# Patient Record
Sex: Female | Born: 1955 | Race: Black or African American | Hispanic: No | Marital: Single | Smoking: Never smoker
Health system: Southern US, Community
[De-identification: ages and names within clinical notes are randomized; demographics above are authoritative.]

## PROBLEM LIST (undated history)

## (undated) DIAGNOSIS — Z9884 Bariatric surgery status: Secondary | ICD-10-CM

## (undated) DIAGNOSIS — R7303 Prediabetes: Secondary | ICD-10-CM

## (undated) DIAGNOSIS — R519 Headache, unspecified: Secondary | ICD-10-CM

## (undated) DIAGNOSIS — E785 Hyperlipidemia, unspecified: Secondary | ICD-10-CM

## (undated) DIAGNOSIS — I2510 Atherosclerotic heart disease of native coronary artery without angina pectoris: Secondary | ICD-10-CM

## (undated) DIAGNOSIS — M150 Primary generalized (osteo)arthritis: Secondary | ICD-10-CM

## (undated) DIAGNOSIS — F329 Major depressive disorder, single episode, unspecified: Secondary | ICD-10-CM

## (undated) DIAGNOSIS — M1710 Unilateral primary osteoarthritis, unspecified knee: Secondary | ICD-10-CM

## (undated) DIAGNOSIS — R51 Headache: Secondary | ICD-10-CM

## (undated) DIAGNOSIS — F32A Depression, unspecified: Secondary | ICD-10-CM

## (undated) DIAGNOSIS — M179 Osteoarthritis of knee, unspecified: Secondary | ICD-10-CM

## (undated) DIAGNOSIS — I10 Essential (primary) hypertension: Secondary | ICD-10-CM

## (undated) HISTORY — PX: JOINT REPLACEMENT: SHX530

## (undated) HISTORY — DX: Essential (primary) hypertension: I10

## (undated) HISTORY — PX: CARDIAC CATHETERIZATION: SHX172

## (undated) HISTORY — DX: Hyperlipidemia, unspecified: E78.5

## (undated) HISTORY — PX: LAPAROSCOPIC GASTRIC SLEEVE RESECTION: SHX5895

---
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
## 1986-04-22 HISTORY — PX: VAGINAL HYSTERECTOMY: SUR661

## 1997-11-21 ENCOUNTER — Encounter: Admission: RE | Admit: 1997-11-21 | Discharge: 1997-11-21 | Payer: Self-pay | Admitting: Internal Medicine

## 1998-05-19 ENCOUNTER — Encounter: Admission: RE | Admit: 1998-05-19 | Discharge: 1998-05-19 | Payer: Self-pay | Admitting: Internal Medicine

## 1998-06-05 ENCOUNTER — Encounter: Payer: Self-pay | Admitting: Emergency Medicine

## 1998-06-05 ENCOUNTER — Emergency Department (HOSPITAL_COMMUNITY): Admission: EM | Admit: 1998-06-05 | Discharge: 1998-06-05 | Payer: Self-pay | Admitting: Emergency Medicine

## 1998-11-20 ENCOUNTER — Emergency Department (HOSPITAL_COMMUNITY): Admission: EM | Admit: 1998-11-20 | Discharge: 1998-11-20 | Payer: Self-pay | Admitting: Emergency Medicine

## 1999-01-09 ENCOUNTER — Encounter: Admission: RE | Admit: 1999-01-09 | Discharge: 1999-01-09 | Payer: Self-pay | Admitting: Internal Medicine

## 1999-01-26 ENCOUNTER — Encounter: Admission: RE | Admit: 1999-01-26 | Discharge: 1999-04-26 | Payer: Self-pay | Admitting: *Deleted

## 1999-01-26 ENCOUNTER — Encounter: Admission: RE | Admit: 1999-01-26 | Discharge: 1999-01-26 | Payer: Self-pay | Admitting: Internal Medicine

## 1999-02-16 ENCOUNTER — Ambulatory Visit (HOSPITAL_BASED_OUTPATIENT_CLINIC_OR_DEPARTMENT_OTHER): Admission: RE | Admit: 1999-02-16 | Discharge: 1999-02-16 | Payer: Self-pay | Admitting: Orthopedic Surgery

## 1999-03-09 ENCOUNTER — Encounter: Admission: RE | Admit: 1999-03-09 | Discharge: 1999-03-09 | Payer: Self-pay | Admitting: Obstetrics & Gynecology

## 1999-06-18 ENCOUNTER — Emergency Department (HOSPITAL_COMMUNITY): Admission: EM | Admit: 1999-06-18 | Discharge: 1999-06-18 | Payer: Self-pay | Admitting: Emergency Medicine

## 1999-06-27 ENCOUNTER — Emergency Department (HOSPITAL_COMMUNITY): Admission: EM | Admit: 1999-06-27 | Discharge: 1999-06-27 | Payer: Self-pay | Admitting: Emergency Medicine

## 1999-09-27 ENCOUNTER — Encounter: Admission: RE | Admit: 1999-09-27 | Discharge: 1999-09-27 | Payer: Self-pay | Admitting: Internal Medicine

## 2000-01-13 ENCOUNTER — Emergency Department (HOSPITAL_COMMUNITY): Admission: EM | Admit: 2000-01-13 | Discharge: 2000-01-13 | Payer: Self-pay | Admitting: Emergency Medicine

## 2000-04-21 ENCOUNTER — Emergency Department (HOSPITAL_COMMUNITY): Admission: EM | Admit: 2000-04-21 | Discharge: 2000-04-21 | Payer: Self-pay | Admitting: Emergency Medicine

## 2000-07-11 ENCOUNTER — Emergency Department (HOSPITAL_COMMUNITY): Admission: EM | Admit: 2000-07-11 | Discharge: 2000-07-11 | Payer: Self-pay | Admitting: Emergency Medicine

## 2000-10-28 ENCOUNTER — Other Ambulatory Visit: Admission: RE | Admit: 2000-10-28 | Discharge: 2000-10-28 | Payer: Self-pay | Admitting: Obstetrics and Gynecology

## 2001-05-13 ENCOUNTER — Emergency Department (HOSPITAL_COMMUNITY): Admission: EM | Admit: 2001-05-13 | Discharge: 2001-05-13 | Payer: Self-pay | Admitting: Emergency Medicine

## 2002-04-22 ENCOUNTER — Emergency Department (HOSPITAL_COMMUNITY): Admission: EM | Admit: 2002-04-22 | Discharge: 2002-04-22 | Payer: Self-pay

## 2002-05-01 ENCOUNTER — Emergency Department (HOSPITAL_COMMUNITY): Admission: EM | Admit: 2002-05-01 | Discharge: 2002-05-01 | Payer: Self-pay

## 2002-11-23 ENCOUNTER — Encounter: Payer: Self-pay | Admitting: Internal Medicine

## 2002-11-23 ENCOUNTER — Encounter: Admission: RE | Admit: 2002-11-23 | Discharge: 2002-11-23 | Payer: Self-pay | Admitting: Internal Medicine

## 2004-01-15 ENCOUNTER — Ambulatory Visit (HOSPITAL_BASED_OUTPATIENT_CLINIC_OR_DEPARTMENT_OTHER): Admission: RE | Admit: 2004-01-15 | Discharge: 2004-01-15 | Payer: Self-pay | Admitting: Internal Medicine

## 2004-05-10 ENCOUNTER — Encounter: Admission: RE | Admit: 2004-05-10 | Discharge: 2004-05-10 | Payer: Self-pay | Admitting: Internal Medicine

## 2005-05-15 ENCOUNTER — Encounter: Admission: RE | Admit: 2005-05-15 | Discharge: 2005-05-15 | Payer: Self-pay | Admitting: Internal Medicine

## 2005-07-04 ENCOUNTER — Emergency Department (HOSPITAL_COMMUNITY): Admission: EM | Admit: 2005-07-04 | Discharge: 2005-07-04 | Payer: Self-pay | Admitting: Emergency Medicine

## 2006-05-28 ENCOUNTER — Emergency Department (HOSPITAL_COMMUNITY): Admission: EM | Admit: 2006-05-28 | Discharge: 2006-05-28 | Payer: Self-pay | Admitting: Emergency Medicine

## 2007-06-27 ENCOUNTER — Emergency Department (HOSPITAL_COMMUNITY): Admission: EM | Admit: 2007-06-27 | Discharge: 2007-06-27 | Payer: Self-pay | Admitting: Emergency Medicine

## 2009-09-22 ENCOUNTER — Emergency Department (HOSPITAL_COMMUNITY): Admission: EM | Admit: 2009-09-22 | Discharge: 2009-09-22 | Payer: Self-pay | Admitting: Emergency Medicine

## 2010-05-13 ENCOUNTER — Encounter: Payer: Self-pay | Admitting: Internal Medicine

## 2010-09-07 NOTE — Procedures (Signed)
NAME:  Kathleen Obrien, Kathleen Obrien               ACCOUNT NO.:  [id number]   MEDICAL RECORD NO.:  [id number]          PATIENT TYPE:  OUT   LOCATION:  SLEEP CENTER                 FACILITY:  [facility]   PHYSICIAN:  Clinton D. Maple Hudson, M.D. DATE OF BIRTH:  1955/08/06   DATE OF STUDY:  01/15/2004  DATE OF DISCHARGE:  01/15/2004                              NOCTURNAL POLYSOMNOGRAM   REFERRING PHYSICIAN:  Dr. Fleet Contras   INDICATION FOR STUDY:  Insomnia with obstructive sleep apnea.  Epworth  sleepiness score 5/24.  BMI 57.  Weight 402 pounds.   MEDICATIONS:  1.  HCTZ.  2.  Percocet.  3.  Celebrex.   SLEEP ARCHITECTURE:  Short total sleep time 261 minutes with sleep  efficiency 58%.  Stage I was 5%, stage II 77%, stages III and IV were  absent.  REM was 18% of total sleep time.  Latency to sleep onset 100  minutes.  Latency to REM 212 minutes.  Sustained sleep was not achieved  until 12:30 a.m. and there was a prolonged interval awake between 2 a.m. and  3:30 a.m.   RESPIRATORY DATA:  RDI 20/hour indicating moderate obstructive sleep  apnea/hypopnea syndrome.  This reflected 78 hypopneas and 10 obstructive  apneas.  Events were not positional.  REM RDI was 70/hour.  The technician  was unable to utilize split study protocol for CPAP titration because  sustained events did not develop until too late to rule out time for  adequate titration.   OXYGEN DATA:  Mild to moderate snoring with intervals of severe oxygen  desaturation to a nadir of 65% associated with respiratory events.  Otherwise, mean oxygen saturation was 92-94%, somewhat lower during REM.   CARDIAC DATA:  Normal sinus rhythm.   MOVEMENT/PARASOMNIA:  Occasional leg jerk with insignificant effect on  sleep.   IMPRESSION/RECOMMENDATION:  Moderate obstructive sleep apnea/hypopnea  syndrome, RDI 20/hour with oxygen desaturation to 65%.  Consider return for  CPAP titration or evaluate for alternative therapies as appropriate.    CDY/MEDQ  D:  01/22/2004 12:18:31  T:  01/23/2004 08:01:04  Job:  [id number]

## 2011-01-14 LAB — BASIC METABOLIC PANEL
BUN: 9
CO2: 29
Calcium: 9.1
Creatinine, Ser: 0.76

## 2011-01-14 LAB — URINALYSIS, ROUTINE W REFLEX MICROSCOPIC
Hgb urine dipstick: NEGATIVE
Ketones, ur: NEGATIVE
Nitrite: NEGATIVE

## 2012-04-28 ENCOUNTER — Other Ambulatory Visit: Payer: Self-pay | Admitting: Internal Medicine

## 2012-04-28 DIAGNOSIS — Z1231 Encounter for screening mammogram for malignant neoplasm of breast: Secondary | ICD-10-CM

## 2012-05-22 ENCOUNTER — Ambulatory Visit: Payer: Self-pay

## 2012-05-29 ENCOUNTER — Ambulatory Visit
Admission: RE | Admit: 2012-05-29 | Discharge: 2012-05-29 | Disposition: A | Discharge status: Home / Self Care | Payer: 59 | Source: Ambulatory Visit | Attending: Internal Medicine | Admitting: Internal Medicine

## 2012-05-29 DIAGNOSIS — Z1231 Encounter for screening mammogram for malignant neoplasm of breast: Secondary | ICD-10-CM

## 2013-07-01 ENCOUNTER — Encounter (INDEPENDENT_AMBULATORY_CARE_PROVIDER_SITE_OTHER): Payer: Self-pay | Admitting: General Surgery

## 2013-07-01 ENCOUNTER — Other Ambulatory Visit (INDEPENDENT_AMBULATORY_CARE_PROVIDER_SITE_OTHER): Payer: Self-pay | Admitting: General Surgery

## 2013-07-01 ENCOUNTER — Encounter (INDEPENDENT_AMBULATORY_CARE_PROVIDER_SITE_OTHER): Payer: Self-pay

## 2013-07-01 ENCOUNTER — Ambulatory Visit (INDEPENDENT_AMBULATORY_CARE_PROVIDER_SITE_OTHER): Payer: 59 | Admitting: General Surgery

## 2013-07-01 VITALS — BP 130/86 | HR 72 | Temp 98.6°F | Resp 18 | Ht 68.0 in | Wt 357.0 lb

## 2013-07-01 DIAGNOSIS — Z6841 Body Mass Index (BMI) 40.0 and over, adult: Secondary | ICD-10-CM

## 2013-07-01 NOTE — Progress Notes (Addendum)
Patient ID: Kathleen Obrien, female   DOB: [birth year], 58 y.o.   MRN: [id number]  Chief Complaint  Patient presents with  . Bariatric Pre-op    HPI Kathleen Obrien is a 58 y.o. female.   HPI 58 yo morbidly obese AAF referred by Dr Cyndy Freeze For evaluation for weight loss surgery. The patient is primarily interested in a Roux-en-Y gastric bypass. After attending 3 seminars she believes this will be the most successful procedure for her. The idea of a laparoscopic adjustable gastric band does not appeal to her.   She has struggled with her weight for many years. She states that she has lost and gained weight numerous times. Despite numerous attempts were sustained weight loss she has been unsuccessful. She has tried Weight Watchers, formula 3, Sensa , and physician directed weight loss-all without any long-term success. She did Weight Watchers twice. The first attempt she lost 80 pounds but gained it all back. She makes furniture. She states that she is tired of not being able to get around due to her severe bilateral knee pain. She also states that she also wants to feel like a person again and be more independent  Past Medical History  Diagnosis Date  . Hyperlipidemia   . Hypertension   . Diabetes mellitus without complication     Past Surgical History  Procedure Laterality Date  . Cesarean section    . Abdominal hysterectomy      History reviewed. No pertinent family history.  Social History History  Substance Use Topics  . Smoking status: Never Smoker   . Smokeless tobacco: Not on file  . Alcohol Use: No    No Known Allergies  Current Outpatient Prescriptions  Medication Sig Dispense Refill  . aMILoride (MIDAMOR) 5 MG tablet       . atenolol (TENORMIN) 50 MG tablet       . atorvastatin (LIPITOR) 10 MG tablet       . Diclofenac Sodium 1.5 % SOLN       . losartan (COZAAR) 100 MG tablet       . meloxicam (MOBIC) 15 MG tablet       . metFORMIN (GLUCOPHAGE-XR) 500 MG  24 hr tablet       . NITROSTAT 0.4 MG SL tablet       . traMADol (ULTRAM) 50 MG tablet        No current facility-administered medications for this visit.    Review of Systems Review of Systems  Constitutional: Negative for fever, activity change, appetite change and unexpected weight change.  HENT: Negative for nosebleeds and trouble swallowing.   Eyes: Negative for photophobia and visual disturbance.  Respiratory: Negative for chest tightness and shortness of breath.   Cardiovascular: Negative for chest pain and leg swelling.       Denies CP, SOB, orthopnea, PND, positive DOE; no jaw/neck/arm pain  Gastrointestinal: Negative for nausea, vomiting, abdominal pain, diarrhea and constipation.       Denies reflux  Genitourinary: Negative for dysuria and difficulty urinating.       Last mammogram last year - normal. S/p abd hysterectomy. G1P1  Musculoskeletal: Negative for arthralgias.       Has b/l knee pain - knees have DJD. Has seen orthopedic surgeon and needs b/l knee replacements but was told she needed to lose 75 pounds.   Skin: Negative for pallor and rash.  Neurological: Negative for dizziness, seizures, facial asymmetry and numbness.       Denies TIA  and amaurosis fugax   Hematological: Negative for adenopathy. Does not bruise/bleed easily.  Psychiatric/Behavioral: Negative for behavioral problems and agitation.    Blood pressure 130/86, pulse 72, temperature 98.6 F (37 C), temperature source Temporal, resp. rate 18, height 5\' 8"  (1.727 m), weight 357 lb (161.934 kg).  Physical Exam Physical Exam  Vitals reviewed. Constitutional: She is oriented to person, place, and time. She appears well-developed and well-nourished. No distress.  Morbidly obesity, evenly distributed  HENT:  Head: Normocephalic and atraumatic.  Right Ear: External ear normal.  Left Ear: External ear normal.  Eyes: Conjunctivae are normal. No scleral icterus.  Neck: Normal range of motion. Neck  supple. No tracheal deviation present. No thyromegaly present.  Cardiovascular: Normal rate and normal heart sounds.   Pulmonary/Chest: Effort normal and breath sounds normal. No stridor. No respiratory distress. She has no wheezes.  Abdominal: Soft. She exhibits no distension. There is no tenderness. There is no rebound and no guarding.    Musculoskeletal: She exhibits no edema (no significant edema) and no tenderness.       Right knee: She exhibits decreased range of motion.       Left knee: She exhibits decreased range of motion.  Uses a walker; some bone on bone mvmt in b/l knees  Lymphadenopathy:    She has no cervical adenopathy.  Neurological: She is alert and oriented to person, place, and time. She exhibits normal muscle tone.  Skin: Skin is warm and dry. No rash noted. She is not diaphoretic. No erythema.  Psychiatric: She has a normal mood and affect. Her behavior is normal. Judgment and thought content normal.    Data Reviewed epworth sleepiness scale score 2 Old sleep study from 2005 - moderate OSA  Date Requested Recent colonscopy and path report from Dr Hale Droneamsay - [facility]  Assessment    Morbid obesity BMI 54.3 Diabetes Mellitus type 2 Degenerative Joint Disease - b/l knees HTN HPL     Plan    The patient meets weight loss surgery criteria. I think the patient would be an acceptable candidate for Laparoscopic Roux-en-Y Gastric bypass.   We discussed laparoscopic Roux-en-Y gastric bypass. We discussed the preoperative, operative and postoperative process. Using diagrams, I explained the surgery in detail including the performance of an EGD near the end of the surgery and an Upper GI swallow study on POD 1. We discussed the typical hospital course including a 2-3 day stay baring any complications.   The patient was given educational material. I quoted the patient that they can expect to lose 50-70% of  their excess weight with the gastric bypass. We did discuss the possibility of weight regain several years after the procedure.  We discussed the risk and benefits of surgery including but not limited to anesthesia risk, bleeding, infection, anastomotic edema requiring a few additional days in the hospital, postop nausea, possible conversion to open procedure, blood clot formation, anastomotic leak, anastomotic stricture, ulcer formation, death, respiratory complications, intestinal blockage, internal hernia, gallstone formation, vitamin and nutritional deficiencies, hair loss, weight regain injury to surrounding structures, failure to lose weight and mood changes.  We discussed that before and after surgery that there would be an alteration in their diet. I explained that we have put them on a diet 2 weeks before surgery. I also explained that they would be on a liquid diet for 2 weeks after surgery. We discussed that they would have to avoid certain  foods such as sugar after surgery. We discussed the importance of physical activity as well as compliance with our dietary and supplement recommendations and routine follow-up.  I explained to the patient that we will start our evaluation process which includes labs, Upper GI to evaluate stomach and swallowing anatomy, nutritionist consultation, psychiatrist consultation, EKG, CXR, abdominal ultrasound.  She was also given access to watch EMMI video on gastric bypass  I will review her outside cardiology records and then determine whether or not she will need cardiac consultation.   She was given information about our pathway to surgery  Mary Sella. Andrey Campanile, MD, FACS General, Bariatric, & Minimally Invasive Surgery [facility], [place]  Addendum 10/14/2013: Received records from [facility] dated 01/17/2011-29 year old obese female referred by Drs Konrad Felix & Caffie Damme for an abnormal stress test. She had been having atypical chest  pain. She underwent nuclear stress test found to have ischemia at the inferior septal, inferior and apical inferior wall. LV ejection fraction 54%. By Dr Caryl Ada.   Underwent cardiac catheterization on 03/01/2011 by Dr. Holley Raring -  Conclusions: #1 difficult to feel coronaries with contrast to large size vessels, also difficult to visualize due to patient's body habitus specifically in caudal views #2 mild coronary artery disease noted. LV function is normal. Ejection fraction 65% #3 medical treatment recommended  Since she had a clean cardiac catheterization and she has no significant symptoms currently I believe we can forgo formal cardiac consultation. However if her EKG is abnormal that we'll certainly change things.  Mary Sella. Andrey Campanile, MD, FACS General, Bariatric, & Minimally Invasive Surgery [facility], [place]           [facility] M 07/01/2013, 11:41 AM

## 2013-07-01 NOTE — Patient Instructions (Signed)
Congratulations on starting your journey to a healthier life! Over the next few weeks you will be undergoing tests (x-rays and labs) and seeing specialists to help evaluate you for weight loss surgery.  These tests and consultations with a psychologist and nutritionist are needed to prepare you for the lifestyle changes that lie ahead and are often required by insurance companies to approve you for surgery.   Pathway to Surgery:  Over the next few weeks -->Lab work -->Radiology tests   - Chest x-ray - make sure your lungs are normal before surgery  - Upper GI - you drink barium and pictures are taken as it travels down your  esophagus and into your stomach - looks for reflux and a hiatal hernia which may  need to repaired at the same time as your weight loss surgery  - Abdominal Ultrasound - looks at your gallbladder and liver  - Mammogram - up to date mammogram if you are a female -->EKG  -->Sleep study - if you are felt to be at high risk for obstructive sleep apnea -->H. Pylori breath test ([employer]) - you surgeon may order this test to see if you have  a bacteria (H pylori) in your stomach which makes you at higher risk to develop a  ulcer or inflammation of your stomach -->Nutrition consultation -->Psychologist consultation -->Other specialist consults - your surgeon may determine that you need to see a  specialist like a cardiologist or pulmnologist depending on your health history -->Watch EMMI video about your planned weight loss surgery  Two weeks prior to surgery  Go on the extremely low carb liquid diet - this will decrease the size of your liver  which will make surgery safer  Attend preoperative appointment with your surgeon  Attend preoperative surgery class  One week prior to surgery  No aspirin products.  Tylenol is acceptable   24 hours prior to surgery  No alcoholic beverages  Report fever greater than 100.5 or excessive nasal drainage suggesting infection  Continue  bariatric preop diet  Perform bowel prep if ordered  Do not eat or drink anything after midnight the night before surgery  Do not take any medications except those instructed by the anesthesiologist  Morning of surgery  Please arrive at the hospital at least 2 hours before your scheduled surgery time.  No makeup, fingernail polish or jewelry  Bring insurance cards with you  Bring your CPAP mask if you use this  

## 2013-07-02 ENCOUNTER — Other Ambulatory Visit (INDEPENDENT_AMBULATORY_CARE_PROVIDER_SITE_OTHER): Payer: Self-pay | Admitting: General Surgery

## 2013-07-02 DIAGNOSIS — Z1231 Encounter for screening mammogram for malignant neoplasm of breast: Secondary | ICD-10-CM

## 2013-07-09 ENCOUNTER — Other Ambulatory Visit: Payer: Self-pay

## 2013-07-09 ENCOUNTER — Ambulatory Visit (HOSPITAL_COMMUNITY)
Admission: RE | Admit: 2013-07-09 | Discharge: 2013-07-09 | Disposition: A | Discharge status: Home / Self Care | Payer: 59 | Source: Ambulatory Visit | Attending: General Surgery | Admitting: General Surgery

## 2013-07-09 ENCOUNTER — Ambulatory Visit (HOSPITAL_COMMUNITY): Payer: 59

## 2013-07-09 DIAGNOSIS — I10 Essential (primary) hypertension: Secondary | ICD-10-CM | POA: Insufficient documentation

## 2013-07-09 DIAGNOSIS — M1710 Unilateral primary osteoarthritis, unspecified knee: Secondary | ICD-10-CM | POA: Insufficient documentation

## 2013-07-09 DIAGNOSIS — Z6841 Body Mass Index (BMI) 40.0 and over, adult: Secondary | ICD-10-CM | POA: Insufficient documentation

## 2013-07-09 DIAGNOSIS — E785 Hyperlipidemia, unspecified: Secondary | ICD-10-CM | POA: Insufficient documentation

## 2013-07-09 DIAGNOSIS — Z1231 Encounter for screening mammogram for malignant neoplasm of breast: Secondary | ICD-10-CM

## 2013-07-09 DIAGNOSIS — I517 Cardiomegaly: Secondary | ICD-10-CM | POA: Insufficient documentation

## 2013-07-09 DIAGNOSIS — I498 Other specified cardiac arrhythmias: Secondary | ICD-10-CM | POA: Insufficient documentation

## 2013-07-09 DIAGNOSIS — E119 Type 2 diabetes mellitus without complications: Secondary | ICD-10-CM | POA: Insufficient documentation

## 2013-07-30 ENCOUNTER — Ambulatory Visit: Payer: 59

## 2013-07-30 ENCOUNTER — Other Ambulatory Visit: Payer: Self-pay

## 2013-07-30 ENCOUNTER — Ambulatory Visit
Admission: RE | Admit: 2013-07-30 | Discharge: 2013-07-30 | Disposition: A | Discharge status: Home / Self Care | Payer: 59 | Source: Ambulatory Visit

## 2013-07-30 DIAGNOSIS — Z1231 Encounter for screening mammogram for malignant neoplasm of breast: Secondary | ICD-10-CM

## 2013-08-02 ENCOUNTER — Encounter: Payer: Self-pay | Admitting: Dietician

## 2013-08-02 ENCOUNTER — Encounter: Payer: 59 | Attending: General Surgery | Admitting: Dietician

## 2013-08-02 VITALS — Ht 68.0 in | Wt 362.7 lb

## 2013-08-02 DIAGNOSIS — Z01818 Encounter for other preprocedural examination: Secondary | ICD-10-CM | POA: Insufficient documentation

## 2013-08-02 DIAGNOSIS — Z6841 Body Mass Index (BMI) 40.0 and over, adult: Secondary | ICD-10-CM | POA: Insufficient documentation

## 2013-08-02 DIAGNOSIS — Z713 Dietary counseling and surveillance: Secondary | ICD-10-CM | POA: Insufficient documentation

## 2013-08-02 NOTE — Patient Instructions (Signed)
[facility] scheduled to enroll in Pre-Op Class.  Work on following Pre-Op Goals and look for protein shakes to try. Contact insurance company to see if you need to do supervised weight loss.

## 2013-08-02 NOTE — Progress Notes (Signed)
  Pre-Op Assessment Visit:  Pre-Operative RYGB Surgery  Medical Nutrition Therapy:  Appt start time: 1400   End time:  1440.  Patient was seen on 08/02/2013 for Pre-Operative RYGB Nutrition Assessment. Assessment [facility] coordinator on 08/02/2013.   Preferred Learning Style:   No preference indicated   Learning Readiness:   Ready  Handouts given during visit include:  Pre-Op Goals Bariatric Surgery Protein Shakes  Teaching Method Utilized:  Visual Auditory Hands on  Barriers to learning/adherence to lifestyle change: limited mobility  Demonstrated degree of understanding via:  Teach Back   Patient to call the [facility] to enroll in Pre-Op and Post-Op Nutrition Education when surgery date is scheduled.

## 2013-10-01 ENCOUNTER — Encounter (INDEPENDENT_AMBULATORY_CARE_PROVIDER_SITE_OTHER): Payer: Self-pay | Admitting: General Surgery

## 2013-10-15 ENCOUNTER — Other Ambulatory Visit (INDEPENDENT_AMBULATORY_CARE_PROVIDER_SITE_OTHER): Payer: Self-pay | Admitting: General Surgery

## 2014-05-12 ENCOUNTER — Other Ambulatory Visit (INDEPENDENT_AMBULATORY_CARE_PROVIDER_SITE_OTHER): Payer: Self-pay

## 2014-07-11 ENCOUNTER — Other Ambulatory Visit: Payer: Self-pay

## 2014-07-11 DIAGNOSIS — Z1231 Encounter for screening mammogram for malignant neoplasm of breast: Secondary | ICD-10-CM

## 2014-08-09 ENCOUNTER — Ambulatory Visit
Admission: RE | Admit: 2014-08-09 | Discharge: 2014-08-09 | Disposition: A | Discharge status: Home / Self Care | Payer: 59 | Source: Ambulatory Visit

## 2014-08-09 DIAGNOSIS — Z1231 Encounter for screening mammogram for malignant neoplasm of breast: Secondary | ICD-10-CM

## 2015-11-22 ENCOUNTER — Other Ambulatory Visit: Payer: Self-pay | Admitting: Internal Medicine

## 2015-11-22 DIAGNOSIS — Z1231 Encounter for screening mammogram for malignant neoplasm of breast: Secondary | ICD-10-CM

## 2015-11-29 ENCOUNTER — Ambulatory Visit: Payer: 59

## 2015-12-07 ENCOUNTER — Ambulatory Visit: Payer: 59

## 2016-04-22 DIAGNOSIS — Z9884 Bariatric surgery status: Secondary | ICD-10-CM

## 2016-04-22 HISTORY — DX: Bariatric surgery status: Z98.84

## 2017-09-25 ENCOUNTER — Other Ambulatory Visit: Payer: Self-pay | Admitting: Orthopedic Surgery

## 2017-10-01 ENCOUNTER — Encounter (HOSPITAL_COMMUNITY): Payer: Self-pay | Admitting: *Deleted

## 2017-10-17 NOTE — Patient Instructions (Addendum)
Kathleen Obrien  10/17/2017   Your procedure is scheduled on:   10-24-2017  Report to [facility] Main  Entrance  Report to admitting at  7:30 AM    Call this number if you have problems the morning of surgery [phone]   Remember: Do not eat food or drink liquids :After Midnight.     Take these medicines the morning of surgery with A SIP OF WATER:  ATENOLOL, PRILOSEC (OMEPRAZOLE), OXYCODONE  DO NOT TAKE ANY DIABETIC MEDICATIONS DAY OF YOUR SURGERY                               You may not have any metal on your body including hair pins and              piercings  Do not wear jewelry, make-up, lotions, powders or perfumes, deodorant             Do not wear nail polish.  Do not shave  48 hours prior to surgery.    Do not bring valuables to the hospital. [facility] IS NOT             RESPONSIBLE   FOR VALUABLES.  Contacts, dentures or bridgework may not be worn into surgery.  Leave suitcase in the car. After surgery it may be brought to your room.   Special Instructions: COUGH/ DEEP BREATHING AND LEG EXERCISES              Please read over the following fact sheets you were given: _____________________________________________________________________             [facility] - Preparing for Surgery Before surgery, you can play an important role.  Because skin is not sterile, your skin needs to be as free of germs as possible.  You can reduce the number of germs on your skin by washing with CHG (chlorahexidine gluconate) soap before surgery.  CHG is an antiseptic cleaner which kills germs and bonds with the skin to continue killing germs even after washing. Please DO NOT use if you have an allergy to CHG or antibacterial soaps.  If your skin becomes reddened/irritated stop using the CHG and inform your nurse when you arrive at Short Stay. Do not shave (including legs and underarms) for at least 48 hours prior to the first CHG shower.  You may shave your  face/neck. Please follow these instructions carefully:  1.  Shower with CHG Soap the night before surgery and the  morning of Surgery.  2.  If you choose to wash your hair, wash your hair first as usual with your  normal  shampoo.  3.  After you shampoo, rinse your hair and body thoroughly to remove the  shampoo.                                        4.  Use CHG as you would any other liquid soap.  You can apply chg directly  to the skin and wash                       Gently with a scrungie or clean washcloth.  5.  Apply the CHG Soap to your body ONLY FROM THE NECK  DOWN.   Do not use on face/ open                           Wound or open sores. Avoid contact with eyes, ears mouth and genitals (private parts).                       Wash face,  Genitals (private parts) with your normal soap.             6.  Wash thoroughly, paying special attention to the area where your surgery  will be performed.  7.  Thoroughly rinse your body with warm water from the neck down.  8.  DO NOT shower/wash with your normal soap after using and rinsing off  the CHG Soap.             9.  Pat yourself dry with a clean towel.            10.  Wear clean pajamas.            11.  Place clean sheets on your bed the night of your first shower and do not  sleep with pets. Day of Surgery : Do not apply any lotions/deodorants the morning of surgery.  Please wear clean clothes to the hospital/surgery center.  FAILURE TO FOLLOW THESE INSTRUCTIONS MAY RESULT IN THE CANCELLATION OF YOUR SURGERY PATIENT SIGNATURE_________________________________  NURSE SIGNATURE__________________________________  ________________________________________________________________________   Rogelia MireIncentive Spirometer  An incentive spirometer is a tool that can help keep your lungs clear and active. This tool measures how well you are filling your lungs with each breath. Taking long deep breaths may help reverse or decrease the chance of developing  breathing (pulmonary) problems (especially infection) following:  A long period of time when you are unable to move or be active. BEFORE THE PROCEDURE   If the spirometer includes an indicator to show your best effort, your nurse or respiratory therapist will set it to a desired goal.  If possible, sit up straight or lean slightly forward. Try not to slouch.  Hold the incentive spirometer in an upright position. INSTRUCTIONS FOR USE  1. Sit on the edge of your bed if possible, or sit up as far as you can in bed or on a chair. 2. Hold the incentive spirometer in an upright position. 3. Breathe out normally. 4. Place the mouthpiece in your mouth and seal your lips tightly around it. 5. Breathe in slowly and as deeply as possible, raising the piston or the ball toward the top of the column. 6. Hold your breath for 3-5 seconds or for as long as possible. Allow the piston or ball to fall to the bottom of the column. 7. Remove the mouthpiece from your mouth and breathe out normally. 8. Rest for a few seconds and repeat Steps 1 through 7 at least 10 times every 1-2 hours when you are awake. Take your time and take a few normal breaths between deep breaths. 9. The spirometer may include an indicator to show your best effort. Use the indicator as a goal to work toward during each repetition. 10. After each set of 10 deep breaths, practice coughing to be sure your lungs are clear. If you have an incision (the cut made at the time of surgery), support your incision when coughing by placing a pillow or rolled up towels firmly against it. Once you are able to get out of  bed, walk around indoors and cough well. You may stop using the incentive spirometer when instructed by your caregiver.  RISKS AND COMPLICATIONS  Take your time so you do not get dizzy or light-headed.  If you are in pain, you may need to take or ask for pain medication before doing incentive spirometry. It is harder to take a deep  breath if you are having pain. AFTER USE  Rest and breathe slowly and easily.  It can be helpful to keep track of a log of your progress. Your caregiver can provide you with a simple table to help with this. If you are using the spirometer at home, follow these instructions: [place] IF:   You are having difficultly using the spirometer.  You have trouble using the spirometer as often as instructed.  Your pain medication is not giving enough relief while using the spirometer.  You develop fever of 100.5 F (38.1 C) or higher. SEEK IMMEDIATE MEDICAL CARE IF:   You cough up bloody sputum that had not been present before.  You develop fever of 102 F (38.9 C) or greater.  You develop worsening pain at or near the incision site. MAKE SURE YOU:   Understand these instructions.  Will watch your condition.  Will get help right away if you are not doing well or get worse. Document Released: 08/19/2006 Document Revised: 07/01/2011 Document Reviewed: 10/20/2006 ExitCare Patient Information 2014 ExitCare, [place].   ________________________________________________________________________  WHAT IS A BLOOD TRANSFUSION? Blood Transfusion Information  A transfusion is the replacement of blood or some of its parts. Blood is made up of multiple cells which provide different functions.  Red blood cells carry oxygen and are used for blood loss replacement.  White blood cells fight against infection.  Platelets control bleeding.  Plasma helps clot blood.  Other blood products are available for specialized needs, such as hemophilia or other clotting disorders. BEFORE THE TRANSFUSION  Who gives blood for transfusions?   Healthy volunteers who are fully evaluated to make sure their blood is safe. This is blood bank blood. Transfusion therapy is the safest it has ever been in the practice of medicine. Before blood is taken from a donor, a complete history is taken to make sure  that person has no history of diseases nor engages in risky social behavior (examples are intravenous drug use or sexual activity with multiple partners). The donor's travel history is screened to minimize risk of transmitting infections, such as malaria. The donated blood is tested for signs of infectious diseases, such as HIV and hepatitis. The blood is then tested to be sure it is compatible with you in order to minimize the chance of a transfusion reaction. If you or a relative donates blood, this is often done in anticipation of surgery and is not appropriate for emergency situations. It takes many days to process the donated blood. RISKS AND COMPLICATIONS Although transfusion therapy is very safe and saves many lives, the main dangers of transfusion include:   Getting an infectious disease.  Developing a transfusion reaction. This is an allergic reaction to something in the blood you were given. Every precaution is taken to prevent this. The decision to have a blood transfusion has been considered carefully by your caregiver before blood is given. Blood is not given unless the benefits outweigh the risks. AFTER THE TRANSFUSION  Right after receiving a blood transfusion, you will usually feel much better and more energetic. This is especially true if your red blood  cells have gotten low (anemic). The transfusion raises the level of the red blood cells which carry oxygen, and this usually causes an energy increase.  The nurse administering the transfusion will monitor you carefully for complications. HOME CARE INSTRUCTIONS  No special instructions are needed after a transfusion. You may find your energy is better. Speak with your caregiver about any limitations on activity for underlying diseases you may have. SEEK MEDICAL CARE IF:   Your condition is not improving after your transfusion.  You develop redness or irritation at the intravenous (IV) site. SEEK IMMEDIATE MEDICAL CARE IF:  Any of  the following symptoms occur over the next 12 hours:  Shaking chills.  You have a temperature by mouth above 102 F (38.9 C), not controlled by medicine.  Chest, back, or muscle pain.  People around you feel you are not acting correctly or are confused.  Shortness of breath or difficulty breathing.  Dizziness and fainting.  You get a rash or develop hives.  You have a decrease in urine output.  Your urine turns a dark color or changes to pink, red, or brown. Any of the following symptoms occur over the next 10 days:  You have a temperature by mouth above 102 F (38.9 C), not controlled by medicine.  Shortness of breath.  Weakness after normal activity.  The white part of the eye turns yellow (jaundice).  You have a decrease in the amount of urine or are urinating less often.  Your urine turns a dark color or changes to pink, red, or brown. Document Released: 04/05/2000 Document Revised: 07/01/2011 Document Reviewed: 11/23/2007 [facility] Patient Information 2014 [place], [place].  _______________________________________________________________________

## 2017-10-20 ENCOUNTER — Encounter (HOSPITAL_COMMUNITY): Payer: Self-pay

## 2017-10-20 ENCOUNTER — Ambulatory Visit (HOSPITAL_COMMUNITY)
Admission: RE | Admit: 2017-10-20 | Discharge: 2017-10-20 | Disposition: A | Discharge status: Home / Self Care | Payer: Medicare Other | Source: Ambulatory Visit | Attending: Orthopedic Surgery | Admitting: Orthopedic Surgery

## 2017-10-20 ENCOUNTER — Encounter (HOSPITAL_COMMUNITY)
Admission: RE | Admit: 2017-10-20 | Discharge: 2017-10-20 | Disposition: A | Discharge status: Home / Self Care | Payer: Medicare Other | Source: Ambulatory Visit | Attending: Orthopedic Surgery | Admitting: Orthopedic Surgery

## 2017-10-20 ENCOUNTER — Other Ambulatory Visit: Payer: Self-pay

## 2017-10-20 DIAGNOSIS — Z01818 Encounter for other preprocedural examination: Secondary | ICD-10-CM

## 2017-10-20 DIAGNOSIS — R9431 Abnormal electrocardiogram [ECG] [EKG]: Secondary | ICD-10-CM | POA: Diagnosis not present

## 2017-10-20 DIAGNOSIS — Z0183 Encounter for blood typing: Secondary | ICD-10-CM | POA: Insufficient documentation

## 2017-10-20 DIAGNOSIS — M1711 Unilateral primary osteoarthritis, right knee: Secondary | ICD-10-CM | POA: Diagnosis not present

## 2017-10-20 DIAGNOSIS — Z01812 Encounter for preprocedural laboratory examination: Secondary | ICD-10-CM | POA: Insufficient documentation

## 2017-10-20 DIAGNOSIS — I517 Cardiomegaly: Secondary | ICD-10-CM | POA: Insufficient documentation

## 2017-10-20 DIAGNOSIS — R001 Bradycardia, unspecified: Secondary | ICD-10-CM | POA: Insufficient documentation

## 2017-10-20 HISTORY — DX: Bariatric surgery status: Z98.84

## 2017-10-20 HISTORY — DX: Unilateral primary osteoarthritis, unspecified knee: M17.10

## 2017-10-20 HISTORY — DX: Atherosclerotic heart disease of native coronary artery without angina pectoris: I25.10

## 2017-10-20 HISTORY — DX: Prediabetes: R73.03

## 2017-10-20 HISTORY — DX: Osteoarthritis of knee, unspecified: M17.9

## 2017-10-20 LAB — PROTIME-INR
INR: 1.06
Prothrombin Time: 13.7 seconds (ref 11.4–15.2)

## 2017-10-20 LAB — URINALYSIS, ROUTINE W REFLEX MICROSCOPIC
Bilirubin Urine: NEGATIVE
GLUCOSE, UA: NEGATIVE mg/dL
HGB URINE DIPSTICK: NEGATIVE
Ketones, ur: NEGATIVE mg/dL
Leukocytes, UA: NEGATIVE
Nitrite: NEGATIVE
Protein, ur: NEGATIVE mg/dL
SPECIFIC GRAVITY, URINE: 1.03 (ref 1.005–1.030)
pH: 5 (ref 5.0–8.0)

## 2017-10-20 LAB — CBC WITH DIFFERENTIAL/PLATELET
BASOS ABS: 0 10*3/uL (ref 0.0–0.1)
BASOS PCT: 1 %
EOS ABS: 0 10*3/uL (ref 0.0–0.7)
EOS PCT: 1 %
HCT: 42.6 % (ref 36.0–46.0)
Hemoglobin: 14.1 g/dL (ref 12.0–15.0)
Lymphocytes Relative: 58 %
Lymphs Abs: 2.6 10*3/uL (ref 0.7–4.0)
MCH: 30.6 pg (ref 26.0–34.0)
MCHC: 33.1 g/dL (ref 30.0–36.0)
MCV: 92.4 fL (ref 78.0–100.0)
MONO ABS: 0.2 10*3/uL (ref 0.1–1.0)
Monocytes Relative: 4 %
Neutro Abs: 1.6 10*3/uL — ABNORMAL LOW (ref 1.7–7.7)
Neutrophils Relative %: 36 %
PLATELETS: 279 10*3/uL (ref 150–400)
RBC: 4.61 MIL/uL (ref 3.87–5.11)
RDW: 12.6 % (ref 11.5–15.5)
WBC: 4.4 10*3/uL (ref 4.0–10.5)

## 2017-10-20 LAB — BASIC METABOLIC PANEL
Anion gap: 11 (ref 5–15)
BUN: 14 mg/dL (ref 6–20)
CALCIUM: 9.6 mg/dL (ref 8.9–10.3)
CO2: 26 mmol/L (ref 22–32)
Chloride: 106 mmol/L (ref 98–111)
Creatinine, Ser: 0.82 mg/dL (ref 0.44–1.00)
GFR calc Af Amer: >60 mL/min (ref >60)
GFR calc non Af Amer: >60 mL/min (ref >60)
GLUCOSE: 103 mg/dL — AB (ref 70–99)
Potassium: 3.8 mmol/L (ref 3.5–5.1)
Sodium: 143 mmol/L (ref 135–145)

## 2017-10-20 LAB — SURGICAL PCR SCREEN
MRSA, PCR: NEGATIVE
STAPHYLOCOCCUS AUREUS: NEGATIVE

## 2017-10-20 LAB — APTT: APTT: 34 s (ref 24–36)

## 2017-10-20 LAB — ABO/RH: ABO/RH(D): O POS

## 2017-10-20 LAB — HEMOGLOBIN A1C
HEMOGLOBIN A1C: 5.3 % (ref 4.8–5.6)
Mean Plasma Glucose: 105.41 mg/dL

## 2017-10-23 MED ORDER — TRANEXAMIC ACID 1000 MG/10ML IV SOLN
1000.0000 mg | INTRAVENOUS | Status: AC
  Administered 2017-10-24: 1000 mg via INTRAVENOUS
  Filled 2017-10-23: qty 1100

## 2017-10-24 ENCOUNTER — Encounter (HOSPITAL_COMMUNITY): Payer: Self-pay | Admitting: *Deleted

## 2017-10-24 ENCOUNTER — Ambulatory Visit (HOSPITAL_COMMUNITY): Payer: Medicare Other | Admitting: Anesthesiology

## 2017-10-24 ENCOUNTER — Inpatient Hospital Stay (HOSPITAL_COMMUNITY)
Admission: RE | Admit: 2017-10-24 | Discharge: 2017-10-26 | DRG: 470 | Disposition: A | Discharge status: Home Health | Payer: Medicare Other | Source: Ambulatory Visit | Attending: Orthopedic Surgery | Admitting: Orthopedic Surgery

## 2017-10-24 ENCOUNTER — Encounter (HOSPITAL_COMMUNITY)
Admission: RE | Disposition: A | Discharge status: Home Health | Payer: Self-pay | Source: Ambulatory Visit | Attending: Orthopedic Surgery

## 2017-10-24 ENCOUNTER — Other Ambulatory Visit: Payer: Self-pay

## 2017-10-24 DIAGNOSIS — Z6839 Body mass index (BMI) 39.0-39.9, adult: Secondary | ICD-10-CM | POA: Diagnosis not present

## 2017-10-24 DIAGNOSIS — E785 Hyperlipidemia, unspecified: Secondary | ICD-10-CM | POA: Diagnosis present

## 2017-10-24 DIAGNOSIS — I2510 Atherosclerotic heart disease of native coronary artery without angina pectoris: Secondary | ICD-10-CM | POA: Diagnosis present

## 2017-10-24 DIAGNOSIS — M1711 Unilateral primary osteoarthritis, right knee: Secondary | ICD-10-CM | POA: Diagnosis present

## 2017-10-24 DIAGNOSIS — Z79899 Other long term (current) drug therapy: Secondary | ICD-10-CM | POA: Diagnosis not present

## 2017-10-24 DIAGNOSIS — Z9884 Bariatric surgery status: Secondary | ICD-10-CM

## 2017-10-24 DIAGNOSIS — G8929 Other chronic pain: Secondary | ICD-10-CM | POA: Diagnosis present

## 2017-10-24 DIAGNOSIS — R7303 Prediabetes: Secondary | ICD-10-CM | POA: Diagnosis present

## 2017-10-24 DIAGNOSIS — D62 Acute posthemorrhagic anemia: Secondary | ICD-10-CM | POA: Diagnosis not present

## 2017-10-24 DIAGNOSIS — I10 Essential (primary) hypertension: Secondary | ICD-10-CM | POA: Diagnosis present

## 2017-10-24 DIAGNOSIS — Z791 Long term (current) use of non-steroidal anti-inflammatories (NSAID): Secondary | ICD-10-CM

## 2017-10-24 DIAGNOSIS — Z79891 Long term (current) use of opiate analgesic: Secondary | ICD-10-CM | POA: Diagnosis not present

## 2017-10-24 HISTORY — PX: TOTAL KNEE ARTHROPLASTY: SHX125

## 2017-10-24 LAB — GLUCOSE, CAPILLARY: Glucose-Capillary: 107 mg/dL — ABNORMAL HIGH (ref 70–99)

## 2017-10-24 LAB — TYPE AND SCREEN
ABO/RH(D): O POS
Antibody Screen: NEGATIVE

## 2017-10-24 SURGERY — ARTHROPLASTY, KNEE, TOTAL
Anesthesia: Spinal | Site: Knee | Laterality: Right

## 2017-10-24 MED ORDER — FENTANYL CITRATE (PF) 100 MCG/2ML IJ SOLN
50.0000 ug | INTRAMUSCULAR | Status: AC
  Administered 2017-10-24: 50 ug via INTRAVENOUS
  Filled 2017-10-24: qty 2

## 2017-10-24 MED ORDER — CEFAZOLIN SODIUM-DEXTROSE 2-4 GM/100ML-% IV SOLN
2.0000 g | Freq: Four times a day (QID) | INTRAVENOUS | Status: AC
  Administered 2017-10-24 (×2): 2 g via INTRAVENOUS
  Filled 2017-10-24 (×2): qty 100

## 2017-10-24 MED ORDER — BUPIVACAINE-EPINEPHRINE (PF) 0.5% -1:200000 IJ SOLN
INTRAMUSCULAR | Status: AC
  Filled 2017-10-24: qty 30

## 2017-10-24 MED ORDER — DEXTROSE 5 % IV SOLN
3.0000 g | INTRAVENOUS | Status: AC
  Administered 2017-10-24: 3 g via INTRAVENOUS
  Filled 2017-10-24: qty 3

## 2017-10-24 MED ORDER — TRANEXAMIC ACID 1000 MG/10ML IV SOLN
1000.0000 mg | Freq: Once | INTRAVENOUS | Status: AC
  Administered 2017-10-24: 1000 mg via INTRAVENOUS
  Filled 2017-10-24: qty 1100

## 2017-10-24 MED ORDER — PROMETHAZINE HCL 25 MG/ML IJ SOLN
INTRAMUSCULAR | Status: AC
  Filled 2017-10-24: qty 1

## 2017-10-24 MED ORDER — ONDANSETRON HCL 4 MG/2ML IJ SOLN
INTRAMUSCULAR | Status: DC | PRN
  Administered 2017-10-24: 4 mg via INTRAVENOUS

## 2017-10-24 MED ORDER — FENTANYL CITRATE (PF) 100 MCG/2ML IJ SOLN
INTRAMUSCULAR | Status: AC
  Filled 2017-10-24: qty 2

## 2017-10-24 MED ORDER — OXYCODONE HCL 5 MG PO TABS
5.0000 mg | ORAL_TABLET | ORAL | Status: DC | PRN
  Administered 2017-10-24 – 2017-10-26 (×5): 10 mg via ORAL
  Filled 2017-10-24 (×5): qty 2

## 2017-10-24 MED ORDER — SODIUM CHLORIDE 0.9 % IJ SOLN
INTRAMUSCULAR | Status: AC
  Filled 2017-10-24: qty 50

## 2017-10-24 MED ORDER — ALUM & MAG HYDROXIDE-SIMETH 200-200-20 MG/5ML PO SUSP: 30.0000 mL | ORAL | Status: DC | PRN

## 2017-10-24 MED ORDER — OXYCODONE HCL 5 MG PO TABS
5.0000 mg | ORAL_TABLET | Freq: Once | ORAL | Status: AC | PRN
Start: 2017-10-24 — End: 2017-10-24
  Administered 2017-10-24: 5 mg via ORAL

## 2017-10-24 MED ORDER — PRAVASTATIN SODIUM 20 MG PO TABS
10.0000 mg | ORAL_TABLET | Freq: Every day | ORAL | Status: DC
Start: 2017-10-24 — End: 2017-10-26
  Administered 2017-10-24 – 2017-10-25 (×2): 10 mg via ORAL
  Filled 2017-10-24 (×2): qty 1

## 2017-10-24 MED ORDER — CELECOXIB 200 MG PO CAPS
200.0000 mg | ORAL_CAPSULE | Freq: Two times a day (BID) | ORAL | Status: DC
  Administered 2017-10-24 – 2017-10-26 (×4): 200 mg via ORAL
  Filled 2017-10-24 (×4): qty 1

## 2017-10-24 MED ORDER — TIZANIDINE HCL 4 MG PO TABS: 4.0000 mg | ORAL_TABLET | Freq: Three times a day (TID) | ORAL | 0 refills | Status: DC | PRN

## 2017-10-24 MED ORDER — DEXAMETHASONE SODIUM PHOSPHATE 10 MG/ML IJ SOLN: 10.0000 mg | Freq: Two times a day (BID) | INTRAMUSCULAR | Status: DC

## 2017-10-24 MED ORDER — ACETAMINOPHEN 325 MG PO TABS: 325.0000 mg | ORAL_TABLET | Freq: Four times a day (QID) | ORAL | Status: DC | PRN

## 2017-10-24 MED ORDER — HYDROMORPHONE HCL 1 MG/ML IJ SOLN: 0.5000 mg | INTRAMUSCULAR | Status: DC | PRN

## 2017-10-24 MED ORDER — METHOCARBAMOL 500 MG PO TABS
500.0000 mg | ORAL_TABLET | Freq: Four times a day (QID) | ORAL | Status: DC | PRN
  Administered 2017-10-25 (×2): 500 mg via ORAL
  Filled 2017-10-24 (×2): qty 1

## 2017-10-24 MED ORDER — ONDANSETRON HCL 4 MG PO TABS: 4.0000 mg | ORAL_TABLET | Freq: Four times a day (QID) | ORAL | Status: DC | PRN

## 2017-10-24 MED ORDER — ATENOLOL 50 MG PO TABS
50.0000 mg | ORAL_TABLET | Freq: Two times a day (BID) | ORAL | Status: DC
  Administered 2017-10-24 – 2017-10-26 (×4): 50 mg via ORAL
  Filled 2017-10-24 (×4): qty 1

## 2017-10-24 MED ORDER — BUPIVACAINE-EPINEPHRINE 0.5% -1:200000 IJ SOLN
INTRAMUSCULAR | Status: DC | PRN
  Administered 2017-10-24: 20 mL

## 2017-10-24 MED ORDER — LACTATED RINGERS IV SOLN
INTRAVENOUS | Status: DC
  Administered 2017-10-24 (×3): via INTRAVENOUS

## 2017-10-24 MED ORDER — BISACODYL 5 MG PO TBEC: 5.0000 mg | DELAYED_RELEASE_TABLET | Freq: Every day | ORAL | Status: DC | PRN

## 2017-10-24 MED ORDER — HYDROMORPHONE HCL 1 MG/ML IJ SOLN
INTRAMUSCULAR | Status: AC
  Filled 2017-10-24: qty 1

## 2017-10-24 MED ORDER — DEXAMETHASONE SODIUM PHOSPHATE 10 MG/ML IJ SOLN
10.0000 mg | Freq: Two times a day (BID) | INTRAMUSCULAR | Status: AC
  Administered 2017-10-24 – 2017-10-25 (×3): 10 mg via INTRAVENOUS
  Filled 2017-10-24 (×3): qty 1

## 2017-10-24 MED ORDER — DOCUSATE SODIUM 100 MG PO CAPS: 100.0000 mg | ORAL_CAPSULE | Freq: Two times a day (BID) | ORAL | 0 refills | Status: DC

## 2017-10-24 MED ORDER — CHLORHEXIDINE GLUCONATE 4 % EX LIQD: 60.0000 mL | Freq: Once | CUTANEOUS | Status: DC

## 2017-10-24 MED ORDER — FENTANYL CITRATE (PF) 250 MCG/5ML IJ SOLN
INTRAMUSCULAR | Status: AC
  Filled 2017-10-24: qty 5

## 2017-10-24 MED ORDER — DIPHENHYDRAMINE HCL 12.5 MG/5ML PO ELIX: 12.5000 mg | ORAL_SOLUTION | ORAL | Status: DC | PRN

## 2017-10-24 MED ORDER — ACETAMINOPHEN 10 MG/ML IV SOLN
1000.0000 mg | Freq: Once | INTRAVENOUS | Status: DC | PRN
  Administered 2017-10-24: 1000 mg via INTRAVENOUS

## 2017-10-24 MED ORDER — CLONIDINE HCL (ANALGESIA) 100 MCG/ML EP SOLN
EPIDURAL | Status: DC | PRN
  Administered 2017-10-24: 50 ug

## 2017-10-24 MED ORDER — ONDANSETRON HCL 4 MG/2ML IJ SOLN: 4.0000 mg | Freq: Four times a day (QID) | INTRAMUSCULAR | Status: DC | PRN

## 2017-10-24 MED ORDER — OXYCODONE HCL ER 10 MG PO T12A
20.0000 mg | EXTENDED_RELEASE_TABLET | Freq: Two times a day (BID) | ORAL | Status: DC
  Administered 2017-10-24 – 2017-10-26 (×4): 20 mg via ORAL
  Filled 2017-10-24 (×5): qty 2

## 2017-10-24 MED ORDER — BUPIVACAINE LIPOSOME 1.3 % IJ SUSP
20.0000 mL | Freq: Once | INTRAMUSCULAR | Status: DC
  Filled 2017-10-24: qty 20

## 2017-10-24 MED ORDER — DOCUSATE SODIUM 100 MG PO CAPS
100.0000 mg | ORAL_CAPSULE | Freq: Two times a day (BID) | ORAL | Status: DC
  Administered 2017-10-24 – 2017-10-26 (×4): 100 mg via ORAL
  Filled 2017-10-24 (×4): qty 1

## 2017-10-24 MED ORDER — PROPOFOL 500 MG/50ML IV EMUL
INTRAVENOUS | Status: DC | PRN
  Administered 2017-10-24: 100 mg via INTRAVENOUS
  Administered 2017-10-24: 200 mg via INTRAVENOUS

## 2017-10-24 MED ORDER — ROPIVACAINE HCL 7.5 MG/ML IJ SOLN
INTRAMUSCULAR | Status: DC | PRN
  Administered 2017-10-24: 20 mL via PERINEURAL

## 2017-10-24 MED ORDER — SODIUM CHLORIDE 0.9 % IJ SOLN
INTRAMUSCULAR | Status: DC | PRN
  Administered 2017-10-24: 30 mL

## 2017-10-24 MED ORDER — OXYCODONE-ACETAMINOPHEN 5-325 MG PO TABS: 1.0000 | ORAL_TABLET | ORAL | 0 refills | Status: DC | PRN

## 2017-10-24 MED ORDER — BUPIVACAINE LIPOSOME 1.3 % IJ SUSP
INTRAMUSCULAR | Status: DC | PRN
  Administered 2017-10-24: 20 mL

## 2017-10-24 MED ORDER — NITROGLYCERIN 0.4 MG SL SUBL: 0.4000 mg | SUBLINGUAL_TABLET | SUBLINGUAL | Status: DC | PRN

## 2017-10-24 MED ORDER — 0.9 % SODIUM CHLORIDE (POUR BTL) OPTIME
TOPICAL | Status: DC | PRN
  Administered 2017-10-24: 1000 mL

## 2017-10-24 MED ORDER — STERILE WATER FOR IRRIGATION IR SOLN
Status: DC | PRN
  Administered 2017-10-24: 2000 mL

## 2017-10-24 MED ORDER — PROMETHAZINE HCL 25 MG/ML IJ SOLN
6.2500 mg | INTRAMUSCULAR | Status: DC | PRN
  Administered 2017-10-24: 6.25 mg via INTRAVENOUS

## 2017-10-24 MED ORDER — OXYCODONE HCL 5 MG/5ML PO SOLN
5.0000 mg | Freq: Once | ORAL | Status: AC | PRN
  Filled 2017-10-24: qty 5

## 2017-10-24 MED ORDER — FENTANYL CITRATE (PF) 100 MCG/2ML IJ SOLN
INTRAMUSCULAR | Status: DC | PRN
  Administered 2017-10-24 (×3): 50 ug via INTRAVENOUS
  Administered 2017-10-24: 100 ug via INTRAVENOUS
  Administered 2017-10-24 (×4): 50 ug via INTRAVENOUS

## 2017-10-24 MED ORDER — PROPOFOL 10 MG/ML IV BOLUS
INTRAVENOUS | Status: AC
  Filled 2017-10-24: qty 60

## 2017-10-24 MED ORDER — POLYETHYLENE GLYCOL 3350 17 G PO PACK: 17.0000 g | PACK | Freq: Every day | ORAL | Status: DC | PRN

## 2017-10-24 MED ORDER — ASPIRIN EC 325 MG PO TBEC
325.0000 mg | DELAYED_RELEASE_TABLET | Freq: Two times a day (BID) | ORAL | Status: DC
  Administered 2017-10-24 – 2017-10-26 (×4): 325 mg via ORAL
  Filled 2017-10-24 (×4): qty 1

## 2017-10-24 MED ORDER — SODIUM CHLORIDE 0.9 % IR SOLN
Status: DC | PRN
  Administered 2017-10-24: 1000 mL

## 2017-10-24 MED ORDER — MAGNESIUM CITRATE PO SOLN
1.0000 | Freq: Once | ORAL | Status: DC | PRN
Start: 2017-10-24 — End: 2017-10-26

## 2017-10-24 MED ORDER — DEXTROSE 5 % IV SOLN
500.0000 mg | Freq: Four times a day (QID) | INTRAVENOUS | Status: DC | PRN
  Administered 2017-10-24: 500 mg via INTRAVENOUS
  Filled 2017-10-24: qty 550

## 2017-10-24 MED ORDER — PHENYLEPHRINE 40 MCG/ML (10ML) SYRINGE FOR IV PUSH (FOR BLOOD PRESSURE SUPPORT)
PREFILLED_SYRINGE | INTRAVENOUS | Status: DC | PRN
  Administered 2017-10-24: 40 ug via INTRAVENOUS

## 2017-10-24 MED ORDER — MIDAZOLAM HCL 2 MG/2ML IJ SOLN
1.0000 mg | INTRAMUSCULAR | Status: AC
  Administered 2017-10-24: 2 mg via INTRAVENOUS
  Filled 2017-10-24: qty 2

## 2017-10-24 MED ORDER — PHENYLEPHRINE 40 MCG/ML (10ML) SYRINGE FOR IV PUSH (FOR BLOOD PRESSURE SUPPORT)
PREFILLED_SYRINGE | INTRAVENOUS | Status: AC
  Filled 2017-10-24: qty 10

## 2017-10-24 MED ORDER — OXYCODONE HCL 5 MG PO TABS
ORAL_TABLET | ORAL | Status: AC
  Administered 2017-10-25: 10 mg via ORAL
  Filled 2017-10-24: qty 1

## 2017-10-24 MED ORDER — HYDROMORPHONE HCL 1 MG/ML IJ SOLN
0.5000 mg | INTRAMUSCULAR | Status: DC | PRN
  Administered 2017-10-24 – 2017-10-25 (×2): 1 mg via INTRAVENOUS
  Filled 2017-10-24 (×2): qty 1

## 2017-10-24 MED ORDER — HYDROMORPHONE HCL 1 MG/ML IJ SOLN
0.2500 mg | INTRAMUSCULAR | Status: DC | PRN
  Administered 2017-10-24 (×4): 0.5 mg via INTRAVENOUS

## 2017-10-24 MED ORDER — PANTOPRAZOLE SODIUM 40 MG PO TBEC
40.0000 mg | DELAYED_RELEASE_TABLET | Freq: Every day | ORAL | Status: DC
  Administered 2017-10-25 – 2017-10-26 (×2): 40 mg via ORAL
  Filled 2017-10-24 (×2): qty 1

## 2017-10-24 MED ORDER — DEXAMETHASONE SODIUM PHOSPHATE 10 MG/ML IJ SOLN
INTRAMUSCULAR | Status: DC | PRN
  Administered 2017-10-24: 5 mg via INTRAVENOUS

## 2017-10-24 MED ORDER — ACETAMINOPHEN 10 MG/ML IV SOLN
INTRAVENOUS | Status: AC
  Filled 2017-10-24: qty 100

## 2017-10-24 MED ORDER — SODIUM CHLORIDE 0.9 % IV SOLN
INTRAVENOUS | Status: DC
  Administered 2017-10-24 – 2017-10-25 (×2): via INTRAVENOUS

## 2017-10-24 MED ORDER — ASPIRIN EC 325 MG PO TBEC: 325.0000 mg | DELAYED_RELEASE_TABLET | Freq: Two times a day (BID) | ORAL | 0 refills | Status: DC

## 2017-10-24 MED ORDER — GABAPENTIN 300 MG PO CAPS
300.0000 mg | ORAL_CAPSULE | Freq: Two times a day (BID) | ORAL | Status: DC
  Administered 2017-10-24 – 2017-10-26 (×4): 300 mg via ORAL
  Filled 2017-10-24 (×4): qty 1

## 2017-10-24 SURGICAL SUPPLY — 54 items
APL SKNCLS STERI-STRIP NONHPOA (GAUZE/BANDAGES/DRESSINGS) ×1
BAG SPEC THK2 15X12 ZIP CLS (MISCELLANEOUS) ×1
BAG ZIPLOCK 12X15 (MISCELLANEOUS) ×3 IMPLANT
BANDAGE ACE 6X5 VEL STRL LF (GAUZE/BANDAGES/DRESSINGS) ×3 IMPLANT
BENZOIN TINCTURE PRP APPL 2/3 (GAUZE/BANDAGES/DRESSINGS) ×3 IMPLANT
BLADE SAGITTAL 25.0X1.19X90 (BLADE) ×2 IMPLANT
BLADE SAGITTAL 25.0X1.19X90MM (BLADE) ×1
BLADE SAW SGTL 11.0X1.19X90.0M (BLADE) ×3 IMPLANT
BOOTIES KNEE HIGH SLOAN (MISCELLANEOUS) ×3 IMPLANT
BOWL SMART MIX CTS (DISPOSABLE) ×3 IMPLANT
CAPT KNEE TOTAL 3 ATTUNE ×2 IMPLANT
CEMENT HV SMART SET (Cement) ×6 IMPLANT
CUFF TOURN SGL QUICK 34 (TOURNIQUET CUFF) ×3
CUFF TRNQT CYL 34X4X40X1 (TOURNIQUET CUFF) ×1 IMPLANT
DECANTER SPIKE VIAL GLASS SM (MISCELLANEOUS) ×4 IMPLANT
DRAPE U-SHAPE 47X51 STRL (DRAPES) ×3 IMPLANT
DRESSING AQUACEL AG SP 3.5X10 (GAUZE/BANDAGES/DRESSINGS) IMPLANT
DRSG AQUACEL AG SP 3.5X10 (GAUZE/BANDAGES/DRESSINGS) ×3
DURAPREP 26ML APPLICATOR (WOUND CARE) ×3 IMPLANT
ELECT REM PT RETURN 15FT ADLT (MISCELLANEOUS) ×3 IMPLANT
GLOVE BIOGEL PI IND STRL 7.0 (GLOVE) IMPLANT
GLOVE BIOGEL PI IND STRL 7.5 (GLOVE) IMPLANT
GLOVE BIOGEL PI IND STRL 8 (GLOVE) ×2 IMPLANT
GLOVE BIOGEL PI INDICATOR 7.0 (GLOVE) ×4
GLOVE BIOGEL PI INDICATOR 7.5 (GLOVE) ×6
GLOVE BIOGEL PI INDICATOR 8 (GLOVE) ×4
GLOVE ECLIPSE 7.5 STRL STRAW (GLOVE) ×6 IMPLANT
GLOVE SURG SS PI 7.0 STRL IVOR (GLOVE) ×2 IMPLANT
GOWN STRL REUS W/ TWL LRG LVL3 (GOWN DISPOSABLE) IMPLANT
GOWN STRL REUS W/TWL LRG LVL3 (GOWN DISPOSABLE) ×6
GOWN STRL REUS W/TWL XL LVL3 (GOWN DISPOSABLE) ×6 IMPLANT
HANDPIECE INTERPULSE COAX TIP (DISPOSABLE) ×3
HOLDER FOLEY CATH W/STRAP (MISCELLANEOUS) ×2 IMPLANT
HOOD PEEL AWAY FLYTE STAYCOOL (MISCELLANEOUS) ×9 IMPLANT
IMMOBILIZER KNEE 20 (SOFTGOODS) ×3
IMMOBILIZER KNEE 20 THIGH 36 (SOFTGOODS) ×1 IMPLANT
MANIFOLD NEPTUNE II (INSTRUMENTS) ×3 IMPLANT
NEEDLE HYPO 22GX1.5 SAFETY (NEEDLE) ×3 IMPLANT
PACK ICE MAXI GEL EZY WRAP (MISCELLANEOUS) ×3 IMPLANT
PACK TOTAL KNEE CUSTOM (KITS) ×3 IMPLANT
PADDING CAST COTTON 6X4 STRL (CAST SUPPLIES) ×3 IMPLANT
POSITIONER SURGICAL ARM (MISCELLANEOUS) ×3 IMPLANT
SET HNDPC FAN SPRY TIP SCT (DISPOSABLE) ×1 IMPLANT
STAPLER VISISTAT 35W (STAPLE) ×2 IMPLANT
SUT MNCRL AB 3-0 PS2 18 (SUTURE) ×3 IMPLANT
SUT VIC AB 0 CT1 36 (SUTURE) ×3 IMPLANT
SUT VIC AB 1 CT1 36 (SUTURE) ×6 IMPLANT
SUT VIC AB 2-0 CT1 27 (SUTURE) ×6
SUT VIC AB 2-0 CT1 TAPERPNT 27 (SUTURE) ×2 IMPLANT
SYR CONTROL 10ML LL (SYRINGE) ×6 IMPLANT
TOWEL OR NON WOVEN STRL DISP B (DISPOSABLE) ×3 IMPLANT
TRAY FOLEY CATH 14FR (SET/KITS/TRAYS/PACK) ×2 IMPLANT
WRAP KNEE MAXI GEL POST OP (GAUZE/BANDAGES/DRESSINGS) ×2 IMPLANT
YANKAUER SUCT BULB TIP 10FT TU (MISCELLANEOUS) ×3 IMPLANT

## 2017-10-24 NOTE — Anesthesia Procedure Notes (Signed)
Anesthesia Procedure Image    

## 2017-10-24 NOTE — Anesthesia Postprocedure Evaluation (Signed)
Anesthesia Post Note  Patient: Kathleen Obrien  Procedure(s) Performed: RIGHT TOTAL KNEE ARTHROPLASTY (Right Knee)     Patient location during evaluation: PACU Anesthesia Type: General Level of consciousness: awake and alert Pain management: pain level controlled Vital Signs Assessment: post-procedure vital signs reviewed and stable Respiratory status: spontaneous breathing, nonlabored ventilation, respiratory function stable and patient connected to nasal cannula oxygen Cardiovascular status: blood pressure returned to baseline and stable Postop Assessment: no apparent nausea or vomiting Anesthetic complications: no    Last Vitals:  Vitals:   10/24/17 1245 10/24/17 1300  BP: (!) 156/92 (!) 156/87  Pulse: 61 63  Resp: 13 19  Temp:    SpO2: 100% 100%    Last Pain:  Vitals:   10/24/17 1300  TempSrc:   PainSc: 10-Worst pain ever                 Aldan Camey S

## 2017-10-24 NOTE — Anesthesia Procedure Notes (Signed)
Anesthesia Regional Block: Adductor canal block   Pre-Anesthetic Checklist: ,, timeout performed, Correct Patient, Correct Site, Correct Laterality, Correct Procedure, Correct Position, site marked, Risks and benefits discussed,  Surgical consent,  Pre-op evaluation,  At surgeon's request and post-op pain management  Laterality: Right  Prep: chloraprep       Needles:  Injection technique: Single-shot  Needle Type: Echogenic Needle     Needle Length: 9cm      Additional Needles:   Procedures:,,,, ultrasound used (permanent image in chart),,,,  Narrative:  Start time: 10/24/2017 8:54 AM End time: 10/24/2017 9:02 AM Injection made incrementally with aspirations every 5 mL.  Performed by: Personally  Anesthesiologist: Eilene Ghaziose, Aneita Kiger, MD  Additional Notes: Patient tolerated the procedure well without complications

## 2017-10-24 NOTE — H&P (Addendum)
TOTAL KNEE ADMISSION H&P  Patient is being admitted for right total knee arthroplasty.  Subjective:  Chief Complaint:right knee pain.  HPI: Kathleen Obrien, 62 y.o. female, has a history of pain and functional disability in the right knee due to arthritis and has failed non-surgical conservative treatments for greater than 12 weeks to includeNSAID's and/or analgesics, corticosteriod injections, viscosupplementation injections, weight reduction as appropriate and activity modification.  Onset of symptoms was gradual, starting 5 years ago with gradually worsening course since that time. The patient noted no past surgery on the right knee(s).  Patient currently rates pain in the right knee(s) at 8 out of 10 with activity. Patient has night pain, worsening of pain with activity and weight bearing, pain that interferes with activities of daily living, pain with passive range of motion, crepitus and joint swelling.  Patient has evidence of subchondral sclerosis, periarticular osteophytes, joint subluxation and joint space narrowing by imaging studies. This patient has had Failure of all reasonable conservative care. There is no active infection.  Patient Active Problem List   Diagnosis Date Noted  . Morbid obesity with BMI of 50.0-59.9, adult ([place]) 07/01/2013   Past Medical History:  Diagnosis Date  . Coronary artery disease    per cardiac cath 03-01-2011 (done at [facility]) mild nonobstructive cad  . DJD (degenerative joint disease) of knee    right and left  . Hyperlipidemia   . Hypertension   . Pre-diabetes   . S/P gastric bypass 2018    Past Surgical History:  Procedure Laterality Date  . CARDIAC CATHETERIZATION  03-01-2011   [facility]   mild CAD, normal LVF (pCFx 30%),  ef 65%  . CESAREAN SECTION  1980  . LAPAROSCOPIC GASTRIC SLEEVE RESECTION  2018   [place]  . VAGINAL HYSTERECTOMY  1988    Current Facility-Administered Medications  Medication Dose Route Frequency Provider Last Rate Last Dose  .  tranexamic acid (CYKLOKAPRON) 1,000 mg in sodium chloride 0.9 % 100 mL IVPB  1,000 mg Intravenous To OR Dorna Leitz, MD       Current Outpatient Medications  Medication Sig Dispense Refill Last Dose  . atenolol (TENORMIN) 50 MG tablet Take 50 mg by mouth 2 (two) times daily.    Taking  . calcium-vitamin D 250-100 MG-UNIT tablet Take 1 tablet by mouth 2 (two) times daily.     . diclofenac sodium (VOLTAREN) 1 % GEL Apply 2 g topically daily as needed (pain).     . meloxicam (MOBIC) 15 MG tablet Take 15 mg by mouth daily.    Taking  . Multiple Vitamins-Minerals (BARIATRIC MULTIVITAMINS/IRON PO) Take 1 capsule by mouth daily.     [name] NITROSTAT 0.4 MG SL tablet Place 0.4 mg under the tongue every 5 (five) minutes as needed for chest pain.    Taking  . omeprazole (PRILOSEC) 20 MG capsule Take 20 mg by mouth 2 (two) times daily.  3   . Oxycodone HCl 20 MG TABS Take 1 tablet by mouth 3 (three) times daily as needed.  0   . pravastatin (PRAVACHOL) 10 MG tablet Take 10 mg by mouth at bedtime.  3   . magnesium citrate SOLN Take 1 Bottle by mouth once. Per pt as directed by md      Allergies  Allergen Reactions  . Banana Nausea And Vomiting    Social History   Tobacco Use  . Smoking status: Never Smoker  . Smokeless tobacco: Never Used  Substance Use Topics  . Alcohol use:  No    Family History  Problem Relation Age of Onset  . Arthritis Other      ROS ROS: I have reviewed the patient's review of systems thoroughly and there are no positive responses as relates to the HPI. Objective:  Physical Exam  Vital signs in last 24 hours:    Vitals:   10/24/17 0805  BP: (!) 141/95  Pulse: (!) 59  Resp: 16  Temp: 98.1 F (36.7 C)  SpO2: 98%   Well-developed well-nourished patient in no acute distress. Alert and oriented x3 HEENT:within normal limits Cardiac: Regular rate and rhythm Pulmonary: Lungs clear to auscultation Abdomen: Soft and nontender.  Normal active bowel  sounds  Musculoskeletal: (Right knee: Painful range of motion.  Limited range of motion.  Significant varus malalignment.  Trace effusion. Labs: Recent Results (from the past 2160 hour(s))  Surgical pcr screen     Status: None   Collection Time: 10/20/17  1:20 PM  Result Value Ref Range   MRSA, PCR NEGATIVE NEGATIVE   Staphylococcus aureus NEGATIVE NEGATIVE    Comment: (NOTE) The Xpert SA Assay (FDA approved for NASAL specimens in patients 34 years of age and older), is one component of a comprehensive surveillance program. It is not intended to diagnose infection nor to guide or monitor treatment. Performed at [facility], [place] [address] [place], [place] [address]   Type and screen Order type and screen if day of surgery is less than 15 days from draw of preadmission visit or order morning of surgery if day of surgery is greater than 6 days from preadmission visit.     Status: None   Collection Time: 10/20/17  1:54 PM  Result Value Ref Range   ABO/RH(D) O POS    Antibody Screen NEG    Sample Expiration 10/27/2017    Extend sample reason      NO TRANSFUSIONS OR PREGNANCY IN THE PAST 3 MONTHS Performed at [facility], [place] [address], [place], [place] [address]   ABO/Rh     Status: None   Collection Time: 10/20/17  1:54 PM  Result Value Ref Range   ABO/RH(D)      O POS Performed at [facility], [place] [address], [place], [place] [address]   APTT     Status: None   Collection Time: 10/20/17  1:57 PM  Result Value Ref Range   aPTT 34 24 - 36 seconds    Comment: Performed at [facility], [place] [address], [place], [place] [address]  Basic metabolic panel     Status: Abnormal   Collection Time: 10/20/17  1:57 PM  Result Value Ref Range   Sodium 143 135 - 145 mmol/L   Potassium 3.8 3.5 - 5.1 mmol/L   Chloride 106 98 - 111 mmol/L    Comment: Please note change in reference range.   CO2 26 22 - 32  mmol/L   Glucose, Bld 103 (H) 70 - 99 mg/dL    Comment: Please note change in reference range.   BUN 14 6 - 20 mg/dL    Comment: Please note change in reference range.   Creatinine, Ser 0.82 0.44 - 1.00 mg/dL   Calcium 9.6 8.9 - 10.3 mg/dL   GFR calc non Af Amer >60 >60 mL/min   GFR calc Af Amer >60 >60 mL/min    Comment: (NOTE) The eGFR has been calculated using the CKD EPI equation. This calculation has not been validated in  all clinical situations. eGFR's persistently <60 mL/min signify possible Chronic Kidney Disease.    Anion gap 11 5 - 15    Comment: Performed at [facility], [place] [address], [place], [place] [address]  CBC WITH DIFFERENTIAL     Status: Abnormal   Collection Time: 10/20/17  1:57 PM  Result Value Ref Range   WBC 4.4 4.0 - 10.5 K/uL   RBC 4.61 3.87 - 5.11 MIL/uL   Hemoglobin 14.1 12.0 - 15.0 g/dL   HCT 42.6 36.0 - 46.0 %   MCV 92.4 78.0 - 100.0 fL   MCH 30.6 26.0 - 34.0 pg   MCHC 33.1 30.0 - 36.0 g/dL   RDW 12.6 11.5 - 15.5 %   Platelets 279 150 - 400 K/uL   Neutrophils Relative % 36 %   Neutro Abs 1.6 (L) 1.7 - 7.7 K/uL   Lymphocytes Relative 58 %   Lymphs Abs 2.6 0.7 - 4.0 K/uL   Monocytes Relative 4 %   Monocytes Absolute 0.2 0.1 - 1.0 K/uL   Eosinophils Relative 1 %   Eosinophils Absolute 0.0 0.0 - 0.7 K/uL   Basophils Relative 1 %   Basophils Absolute 0.0 0.0 - 0.1 K/uL    Comment: Performed at [facility], [place] [address] [place], [place] [address]  Protime-INR     Status: None   Collection Time: 10/20/17  1:57 PM  Result Value Ref Range   Prothrombin Time 13.7 11.4 - 15.2 seconds   INR 1.06     Comment: Performed at [facility], [place] [address] [place], [place] [address]  Urinalysis, Routine w reflex microscopic     Status: Abnormal   Collection Time: 10/20/17  1:57 PM  Result Value Ref Range   Color, Urine AMBER (A) YELLOW    Comment: BIOCHEMICALS MAY BE AFFECTED BY COLOR    APPearance HAZY (A) CLEAR   Specific Gravity, Urine 1.030 1.005 - 1.030   pH 5.0 5.0 - 8.0   Glucose, UA NEGATIVE NEGATIVE mg/dL   Hgb urine dipstick NEGATIVE NEGATIVE   Bilirubin Urine NEGATIVE NEGATIVE   Ketones, ur NEGATIVE NEGATIVE mg/dL   Protein, ur NEGATIVE NEGATIVE mg/dL   Nitrite NEGATIVE NEGATIVE   Leukocytes, UA NEGATIVE NEGATIVE    Comment: Performed at [facility], [place] [address], [place], [place] [address]  Hemoglobin A1c     Status: None   Collection Time: 10/20/17  1:57 PM  Result Value Ref Range   Hgb A1c MFr Bld 5.3 4.8 - 5.6 %    Comment: (NOTE) Pre diabetes:          5.7%-6.4% Diabetes:              >6.4% Glycemic control for   <7.0% adults with diabetes    Mean Plasma Glucose 105.41 mg/dL    Comment: Performed at [place] [address], [place], [place] [address]    Estimated body mass index is 39.5 kg/m as calculated from the following:   Height as of 10/20/17: _0  (1.753 m).   Weight as of 10/20/17: 121.3 kg (267 lb 8 oz).   Imaging Review Plain radiographs demonstrate severe degenerative joint disease of the right knee(s). The overall alignment issignificant varus. The bone quality appears to be fair for age and reported activity level.   Preoperative templating of the joint replacement has been completed, documented, and submitted to the Operating Room personnel in order to optimize intra-operative equipment  management.       Assessment/Plan:  End stage arthritis, right knee   The patient history, physical examination, clinical judgment of the provider and imaging studies are consistent with end stage degenerative joint disease of the right knee(s) and total knee arthroplasty is deemed medically necessary. The treatment options including medical management, injection therapy arthroscopy and arthroplasty were discussed at length. The risks and benefits of total knee arthroplasty were presented and reviewed. The risks  due to aseptic loosening, infection, stiffness, patella tracking problems, thromboembolic complications and other imponderables were discussed. The patient acknowledged the explanation, agreed to proceed with the plan and consent was signed. Patient is being admitted for inpatient treatment for surgery, pain control, PT, OT, prophylactic antibiotics, VTE prophylaxis, progressive ambulation and ADL's and discharge planning. The patient is planning to be discharged home with home health services

## 2017-10-24 NOTE — Discharge Instructions (Signed)

## 2017-10-24 NOTE — Anesthesia Preprocedure Evaluation (Signed)
Anesthesia Evaluation  Patient identified by MRN, date of birth, ID band Patient awake    Reviewed: Allergy & Precautions, NPO status , Patient's Chart, lab work & pertinent test results  Airway Mallampati: II  TM Distance: >3 FB Neck ROM: Full    Dental no notable dental hx.    Pulmonary neg pulmonary ROS,    Pulmonary exam normal breath sounds clear to auscultation       Cardiovascular hypertension, Normal cardiovascular exam Rhythm:Regular Rate:Normal     Neuro/Psych negative neurological ROS  negative psych ROS   GI/Hepatic negative GI ROS, Neg liver ROS,   Endo/Other  Morbid obesity  Renal/GU negative Renal ROS  negative genitourinary   Musculoskeletal negative musculoskeletal ROS (+)   Abdominal   Peds negative pediatric ROS (+)  Hematology negative hematology ROS (+)   Anesthesia Other Findings   Reproductive/Obstetrics negative OB ROS                             Anesthesia Physical Anesthesia Plan  ASA: III  Anesthesia Plan: Spinal   Post-op Pain Management:    Induction: Intravenous  PONV Risk Score and Plan: Ondansetron, Dexamethasone and Treatment may vary due to age or medical condition  Airway Management Planned: Simple Face Mask  Additional Equipment:   Intra-op Plan:   Post-operative Plan:   Informed Consent: I have reviewed the patients History and Physical, chart, labs and discussed the procedure including the risks, benefits and alternatives for the proposed anesthesia with the patient or authorized representative who has indicated his/her understanding and acceptance.   Dental advisory given  Plan Discussed with: CRNA and Surgeon  Anesthesia Plan Comments:         Anesthesia Quick Evaluation

## 2017-10-24 NOTE — Progress Notes (Signed)
Assisted Dr. Rose with left, ultrasound guided, adductor canal block. Side rails up, monitors on throughout procedure. See vital signs in flow sheet. Tolerated Procedure well.  

## 2017-10-24 NOTE — Anesthesia Procedure Notes (Signed)
Procedure Name: LMA Insertion Performed by: Demetress Tift J, CRNA Pre-anesthesia Checklist: Patient identified, Emergency Drugs available, Suction available, Patient being monitored and Timeout performed Patient Re-evaluated:Patient Re-evaluated prior to induction Oxygen Delivery Method: Circle system utilized Preoxygenation: Pre-oxygenation with 100% oxygen Induction Type: IV induction Ventilation: Mask ventilation without difficulty LMA: LMA inserted LMA Size: 4.0 Number of attempts: 1 Placement Confirmation: positive ETCO2,  CO2 detector and breath sounds checked- equal and bilateral Tube secured with: Tape Dental Injury: Teeth and Oropharynx as per pre-operative assessment        

## 2017-10-24 NOTE — Transfer of Care (Signed)
Immediate Anesthesia Transfer of Care Note  Patient: Kathleen Obrien  Procedure(s) Performed: RIGHT TOTAL KNEE ARTHROPLASTY (Right Knee)  Patient Location: PACU  Anesthesia Type:General  Level of Consciousness: awake, alert  and oriented  Airway & Oxygen Therapy: Patient Spontanous Breathing and Patient connected to face mask oxygen  Post-op Assessment: Report given to RN and Post -op Vital signs reviewed and stable  Post vital signs: Reviewed and stable  Last Vitals:  Vitals Value Taken Time  BP    Temp    Pulse 70 10/24/2017 12:15 PM  Resp 11 10/24/2017 12:15 PM  SpO2 100 % 10/24/2017 12:15 PM  Vitals shown include unvalidated device data.  Last Pain:  Vitals:   10/24/17 0905  TempSrc:   PainSc: 0-No pain      Patients Stated Pain Goal: 4 (10/24/17 0805)  Complications: No apparent anesthesia complications

## 2017-10-24 NOTE — Op Note (Signed)
NAME: Kathleen Obrien, LOGIE MEDICAL RECORD ZO:[id number] ACCOUNT [id number] DATE OF BIRTH:02-09-1956 FACILITY: [facility] LOCATION: [facility] PHYSICIAN:Zyshonne Malecha L. 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   PROCEDURE:  Right total knee replacement with an Attune system, size 6 femur, size 7 tibia, a 6 mm bridging bearing, and a 38 mm all polyethylene patella.  SURGEON:  Jodi Geralds, MD   ASSISTANT:  Marshia Ly, PA-C  ANESTHESIA:  General.  BRIEF HISTORY:  The patient is a 62 year old female with a long history of significant complaints of bilateral knee pain.  She was morbidly obese for many years and underwent surgical weight loss surgery.  She has lost down to a BMI under 40 and she was  taken to the operating room for right total knee replacement.  DESCRIPTION OF PROCEDURE:  The patient was taken to the operating room after adequate anesthesia was obtained with spinal anesthetic.   The patient was placed supine on the operating room table.  The right leg was prepped and draped in usual sterile  fashion.  Following this, the leg was exsanguinated, blood pressure tourniquet inflated to 350 mmHg.  Following this, a midline incision was made.  Subcutaneous tissue was dissected down to the level of the extensor mechanism and a medial parapatellar  arthrotomy was undertaken.  Following this, medial and lateral menisci were removed, retropatellar fat pad was removed, anterior and posterior cruciates and synovium on the anterior aspect of the femur.  Once this was all removed, retractors put in  place.  Attention was turned to the femur.  Intramedullary pilot hole was drilled and a 5 degree valgus inclination cut is made with 9 mm of distal bone resected.  Once this was  completed, attention was turned towards sizing the femur.  It sizes to a 6.   Anterior and posterior cuts were made, chamfers and box.  Six boxes used for making these final cuts.  Once this was done, attention was turned to the tibia.  It is cut perpendicular to its long axis and it was then drilled and keeled, sizing to a size  6.  Once this was done, we went to remove all those significant osteophytes out the back of the knee and there was dramatic and significant osteophytes that needed to be removed.  Once that was completed, the final poly, a size 6, was put in place and  the knee put through a range of motion.  Excellent stability and range of motion were achieved.  Attention turned to the patella where a 38 paddle was chosen and lugs were drilled and then the trial was put in place.  Excellent range of motion and  stability were achieved and at this point, the wounds were irrigated and suctioned dry.  The trial components were removed.  The knee was copiously and thoroughly lavaged with pulsatile lavage irrigation and suctioned dry and the final components were  then cemented into place, size 6 femur, size 7 tibia, and a 6 mm bridging bearing trial was placed.  The cement was allowed to completely harden.  Attention was turned towards placement of the patella, which was placed and held with a clamp.  All excess  bone cement was removed and once the cement was hard, the tourniquet was let down.  All bleeders controlled with electrocautery and the knee  was put through a range of motion.  Excellent stability and range of motion were achieved.  We then removed the  trial poly and put the final polyethylene in place.  Excellent range of motion and stability were achieved.  At this point, the medial parapatellar arthrotomy was closed with #1 Vicryl running, skin with 0 and 2-0 Vicryl and skin staples, sterile  compressive dressing was applied, and the patient was taken to recovery where she was noted to be  in satisfactory condition.  Estimated blood loss for the procedure was 50 mL with the final could be gotten from the anesthetic record.  GN/NUANCE  D:10/24/2017 T:10/24/2017 JOB:[id number]

## 2017-10-24 NOTE — Brief Op Note (Signed)
10/24/2017  1:14 PM  PATIENT:  Kathleen SongArnita B Weimer  62 y.o. female  PRE-OPERATIVE DIAGNOSIS:  RIGHT KNEE DEGENERATIVE JOINT DISEASE  POST-OPERATIVE DIAGNOSIS:  RIGHT KNEE DEGENERATIVE JOINT DISEASE  PROCEDURE:  Procedure(s) with comments: RIGHT TOTAL KNEE ARTHROPLASTY (Right) - Adductor Block  SURGEON:  Surgeon(s) and Role:    Jodi Geralds* Denette Hass, MD - Primary  PHYSICIAN ASSISTANT:   ASSISTANTS: jim bethune   ANESTHESIA:   general  EBL:  50 mL   BLOOD ADMINISTERED:none  DRAINS: none   LOCAL MEDICATIONS USED:  MARCAINE    and experel  SPECIMEN:  No Specimen  DISPOSITION OF SPECIMEN:  N/A  COUNTS:  YES  TOURNIQUET:   Total Tourniquet Time Documented: Thigh (Right) - 79 minutes Total: Thigh (Right) - 79 minutes   DICTATION: .Other Dictation: Dictation Number [id number]  PLAN OF CARE: Admit to inpatient   PATIENT DISPOSITION:  PACU - hemodynamically stable.   Delay start of Pharmacological VTE agent (>24hrs) due to surgical blood loss or risk of bleeding: no

## 2017-10-24 NOTE — Progress Notes (Addendum)
Marshia LyJames Bethune, PA was paged regarding the dose of pt's oxycodone. Pt has an order for 5-10 mg of oxycodone Q-4 hours; however, the pt reports being prescribed and taking 20mg  of oxycodone once daily at home. Pt reports that 5-10 mg of oxy  Q-4 hours isn't adequate to manager her pain.   Currently awaiting a call back from Marshia LyJames Bethune, [place].  Marshia LyJames Bethune return my call and gave verbal orders for 20 mg of oxycodone q 12 hours scheduled; one time dose of IV Toradol 30 mg; modify Dilaudid order by changing frequency from Q-4 to Q-3 hours.   Marshia LyJames Bethune, PA decided not to give IV Toradol due to the pt receiving Celebrex. Toradol verbal order was not put in or given to pt.

## 2017-10-25 DIAGNOSIS — M1711 Unilateral primary osteoarthritis, right knee: Secondary | ICD-10-CM | POA: Diagnosis not present

## 2017-10-25 LAB — BASIC METABOLIC PANEL
ANION GAP: 7 (ref 5–15)
BUN: 13 mg/dL (ref 6–20)
CALCIUM: 8.3 mg/dL — AB (ref 8.9–10.3)
CO2: 26 mmol/L (ref 22–32)
Chloride: 107 mmol/L (ref 98–111)
Creatinine, Ser: 0.66 mg/dL (ref 0.44–1.00)
GFR calc Af Amer: >60 mL/min (ref >60)
GLUCOSE: 147 mg/dL — AB (ref 70–99)
Potassium: 4.5 mmol/L (ref 3.5–5.1)
Sodium: 140 mmol/L (ref 135–145)

## 2017-10-25 LAB — CBC
HCT: 33.5 % — ABNORMAL LOW (ref 36.0–46.0)
Hemoglobin: 11.2 g/dL — ABNORMAL LOW (ref 12.0–15.0)
MCH: 31.1 pg (ref 26.0–34.0)
MCHC: 33.4 g/dL (ref 30.0–36.0)
MCV: 93.1 fL (ref 78.0–100.0)
Platelets: 216 10*3/uL (ref 150–400)
RBC: 3.6 MIL/uL — ABNORMAL LOW (ref 3.87–5.11)
RDW: 12.1 % (ref 11.5–15.5)
WBC: 8.1 10*3/uL (ref 4.0–10.5)

## 2017-10-25 NOTE — Evaluation (Signed)
Physical Therapy Evaluation Patient Details Name: Kathleen Obrien MRN: [id number] DOB: 1955-10-12 Today's Date: 10/25/2017   History of Present Illness  Pt is a 62 year old female s/p R TKA  Clinical Impression  Pt is s/p TKA resulting in the deficits listed below (see PT Problem List).  Pt will benefit from skilled PT to increase their independence and safety with mobility to allow discharge to the venue listed below.  Pt assisted with ambulating in hallway and performed LE exercises.  Pt plans to d/c home with spouse assist.     Follow Up Recommendations Follow surgeon's recommendation for DC plan and follow-up therapies    Equipment Recommendations  None recommended by PT(states she will use her mother's RW)    Recommendations for Other Services       Precautions / Restrictions Precautions Precautions: Knee Required Braces or Orthoses: Knee Immobilizer - Right Restrictions RLE Weight Bearing: Weight bearing as tolerated      Mobility  Bed Mobility Overal bed mobility: Needs Assistance Bed Mobility: Supine to Sit     Supine to sit: Supervision;HOB elevated     General bed mobility comments: verbal cues for self assist, utilized rails   Transfers Overall transfer level: Needs assistance Equipment used: Rolling walker (2 wheeled) Transfers: Sit to/from Stand Sit to Stand: Min assist         General transfer comment: verbal cues for safe technique, assist to rise and steady  Ambulation/Gait Ambulation/Gait assistance: Min guard Gait Distance (Feet): 60 Feet Assistive device: Rolling walker (2 wheeled) Gait Pattern/deviations: Decreased stance time - right;Step-to pattern;Antalgic     General Gait Details: verbal cues for sequence, RW positioning, step length, min/guard for safety  Stairs            Wheelchair Mobility    Modified Rankin (Stroke Patients Only)       Balance                                              Pertinent Vitals/Pain Pain Assessment: 0-10 Pain Score: 3  Pain Location: R knee Pain Descriptors / Indicators: Sore;Aching Pain Intervention(s): Limited activity within patient's tolerance;Ice applied;Repositioned;Monitored during session;Premedicated before session    Home Living Family/patient expects to be discharged to:: Private residence Living Arrangements: Spouse/significant other   Type of Home: House Home Access: Stairs to enter Entrance Stairs-Rails: Left Entrance Stairs-Number of Steps: 2 Home Layout: One level Home Equipment: Environmental consultant - 2 wheels      Prior Function Level of Independence: Independent         Comments: unable to ambulate long distances due to pain     Hand Dominance        Extremity/Trunk Assessment        Lower Extremity Assessment Lower Extremity Assessment: RLE deficits/detail(hx of L knee OA "next TKA") RLE Deficits / Details: able to perform SLR, ankle pumps, AAROM R knee -3-45* limited by pain        Communication   Communication: No difficulties  Cognition Arousal/Alertness: Awake/alert Behavior During Therapy: WFL for tasks assessed/performed Overall Cognitive Status: Within Functional Limits for tasks assessed                                        General Comments  Exercises Total Joint Exercises Ankle Circles/Pumps: AROM;Both;10 reps Quad Sets: AROM;10 reps;Both Short Arc Quad: AROM;Right;10 reps Heel Slides: AAROM;10 reps;Right Hip ABduction/ADduction: AROM;Right;10 reps Straight Leg Raises: AROM;10 reps;Right   Assessment/Plan    PT Assessment Patient needs continued PT services  PT Problem List Decreased strength;Decreased range of motion;Decreased mobility;Decreased knowledge of use of DME;Pain;Decreased knowledge of precautions       PT Treatment Interventions Stair training;Gait training;DME instruction;Therapeutic activities;Therapeutic exercise;Patient/family  education;Functional mobility training    PT Goals (Current goals can be found in the Care Plan section)  Acute Rehab PT Goals PT Goal Formulation: With patient Time For Goal Achievement: 11/01/17 Potential to Achieve Goals: Good    Frequency 7X/week   Barriers to discharge        Co-evaluation               AM-PAC PT "6 Clicks" Daily Activity  Outcome Measure Difficulty turning over in bed (including adjusting bedclothes, sheets and blankets)?: A Little Difficulty moving from lying on back to sitting on the side of the bed? : A Lot Difficulty sitting down on and standing up from a chair with arms (e.g., wheelchair, bedside commode, etc,.)?: Unable Help needed moving to and from a bed to chair (including a wheelchair)?: A Little Help needed walking in hospital room?: A Little Help needed climbing 3-5 steps with a railing? : A Lot 6 Click Score: 14    End of Session Equipment Utilized During Treatment: Gait belt;Right knee immobilizer Activity Tolerance: Patient tolerated treatment well Patient left: in chair;with call bell/phone within reach   PT Visit Diagnosis: Other abnormalities of gait and mobility (R26.89)    Time: [id number] PT Time Calculation (min) (ACUTE ONLY): 45 min   Charges:   PT Evaluation $PT Eval Low Complexity: 1 Low PT Treatments $Therapeutic Exercise: 8-22 mins   PT G Codes:        Zenovia JarredKati Eriel Doyon, PT, DPT 10/25/2017 Pager: [phone]  Maida SaleLEMYRE,KATHrine E 10/25/2017, 1:10 PM

## 2017-10-25 NOTE — Progress Notes (Signed)
Physical Therapy Treatment Patient Details Name: Kathleen Obrien MRN: [id number] DOB: [birth year] Today's Date: 10/25/2017    History of Present Illness Pt is a 62 year old female s/p R TKA    PT Comments    Pt assisted with ambulating in hallway this afternoon and then back to bed.   Follow Up Recommendations  Follow surgeon's recommendation for DC plan and follow-up therapies     Equipment Recommendations  None recommended by PT    Recommendations for Other Services       Precautions / Restrictions Precautions Precautions: Knee Precaution Comments: able to perform SLR Required Braces or Orthoses: Knee Immobilizer - Right Restrictions RLE Weight Bearing: Weight bearing as tolerated    Mobility  Bed Mobility Overal bed mobility: Needs Assistance Bed Mobility: Sit to Supine     Supine to sit: Supervision;HOB elevated Sit to supine: Supervision;HOB elevated   General bed mobility comments: verbal cues for self assist, utilized rails   Transfers Overall transfer level: Needs assistance Equipment used: Rolling walker (2 wheeled) Transfers: Sit to/from Stand Sit to Stand: Min guard         General transfer comment: verbal cues for safe technique  Ambulation/Gait Ambulation/Gait assistance: Min guard Gait Distance (Feet): 120 Feet Assistive device: Rolling walker (2 wheeled) Gait Pattern/deviations: Decreased stance time - right;Step-to pattern;Antalgic     General Gait Details: verbal cues for sequence, RW positioning, step length, min/guard for safety   Stairs             Wheelchair Mobility    Modified Rankin (Stroke Patients Only)       Balance                                            Cognition Arousal/Alertness: Awake/alert Behavior During Therapy: WFL for tasks assessed/performed Overall Cognitive Status: Within Functional Limits for tasks assessed                                         Exercises     General Comments        Pertinent Vitals/Pain Pain Assessment: 0-10 Pain Score: 4  Pain Location: R knee Pain Descriptors / Indicators: Sore;Aching Pain Intervention(s): Limited activity within patient's tolerance;Repositioned;Monitored during session;Ice applied    Home Living Family/patient expects to be discharged to:: Private residence Living Arrangements: Spouse/significant other   Type of Home: House Home Access: Stairs to enter Entrance Stairs-Rails: Left Home Layout: One level Home Equipment: Environmental consultant - 2 wheels      Prior Function Level of Independence: Independent      Comments: unable to ambulate long distances due to pain   PT Goals (current goals can now be found in the care plan section) Acute Rehab PT Goals PT Goal Formulation: With patient Time For Goal Achievement: 11/01/17 Potential to Achieve Goals: Good Progress towards PT goals: Progressing toward goals    Frequency    7X/week      PT Plan Current plan remains appropriate    Co-evaluation              AM-PAC PT "6 Clicks" Daily Activity  Outcome Measure  Difficulty turning over in bed (including adjusting bedclothes, sheets and blankets)?: A Little Difficulty moving from lying on back to sitting on the side  of the bed? : A Lot Difficulty sitting down on and standing up from a chair with arms (e.g., wheelchair, bedside commode, etc,.)?: A Lot Help needed moving to and from a bed to chair (including a wheelchair)?: A Little Help needed walking in hospital room?: A Little Help needed climbing 3-5 steps with a railing? : A Little 6 Click Score: 16    End of Session Equipment Utilized During Treatment: Gait belt Activity Tolerance: Patient tolerated treatment well Patient left: with call bell/phone within reach;in bed   PT Visit Diagnosis: Other abnormalities of gait and mobility (R26.89)     Time: [id number] PT Time Calculation (min) (ACUTE ONLY): 15  min  Charges:  $Gait Training: 8-22 mins                    G Codes:       Zenovia JarredKati Evian Derringer, PT, DPT 10/25/2017 Pager: [phone]   Maida SaleLEMYRE,KATHrine E 10/25/2017, 4:31 PM

## 2017-10-25 NOTE — Progress Notes (Signed)
PATIENT ID: Kathleen Obrien  MRN: [id number]  DOB/AGE:  Feb 20, 1956 / 62 y.o.  1 Day Post-Op Procedure(s) (LRB): RIGHT TOTAL KNEE ARTHROPLASTY (Right)    PROGRESS NOTE Subjective: Patient is alert, oriented, no Nausea, no Vomiting, yes passing gas. Taking PO well. Denies SOB, Chest or Calf Pain. Using Incentive Spirometer, PAS in place. Ambulate WBAT with pt up with therapy, Patient reports pain as mild to moderate .    Objective: Vital signs in last 24 hours: Vitals:   10/24/17 1716 10/24/17 2123 10/25/17 0232 10/25/17 0608  BP: (!) 145/87 111/67 109/69 119/76  Pulse: 61 (!) 58 (!) 55 (!) 57  Resp: 16 18 18 18   Temp:  (!) 97.5 F (36.4 C) 98.5 F (36.9 C) 97.8 F (36.6 C)  TempSrc:  Oral Oral Oral  SpO2: 100% 95% 100% 100%  Weight:      Height:          Intake/Output from previous day: I/O last 3 completed shifts: In: 4621.7 [P.O.:180; I.V.:4076.7; IV Piggyback:365] Out: 700 [Urine:650; Blood:50]   Intake/Output this shift: No intake/output data recorded.   LABORATORY DATA: Recent Labs    10/24/17 1216 10/25/17 0510  WBC  --  8.1  HGB  --  11.2*  HCT  --  33.5*  PLT  --  216  NA  --  140  K  --  4.5  CL  --  107  CO2  --  26  BUN  --  13  CREATININE  --  0.66  GLUCOSE  --  147*  GLUCAP 107*  --   CALCIUM  --  8.3*    Examination: Neurologically intact Neurovascular intact Sensation intact distally Intact pulses distally Dorsiflexion/Plantar flexion intact Incision: dressing C/D/I and no drainage No cellulitis present Compartment soft}  Assessment:   1 Day Post-Op Procedure(s) (LRB): RIGHT TOTAL KNEE ARTHROPLASTY (Right) ADDITIONAL DIAGNOSIS: Expected Acute Blood Loss Anemia, obesity Anticipated LOS equal to or greater than 2 midnights due to - Age 61 and older with one or more of the following:  - Obesity  - Expected need for hospital services (PT, OT, Nursing) required for safe  discharge  - Anticipated need for postoperative skilled nursing  care or inpatient rehab  - Active co-morbidities: Chronic pain requiring opiods     Plan: PT/OT WBAT, AROM and PROM  DVT Prophylaxis:  SCDx72hrs, ASA 325 mg BID x  4 weeks DISCHARGE PLAN: Home DISCHARGE NEEDS: HHPT     Dannielle Burnric Timberlynn Kizziah 10/25/2017, 8:04 AM

## 2017-10-26 DIAGNOSIS — M1711 Unilateral primary osteoarthritis, right knee: Secondary | ICD-10-CM | POA: Diagnosis not present

## 2017-10-26 LAB — CBC
HEMATOCRIT: 30.9 % — AB (ref 36.0–46.0)
Hemoglobin: 10.5 g/dL — ABNORMAL LOW (ref 12.0–15.0)
MCH: 31.3 pg (ref 26.0–34.0)
MCHC: 34 g/dL (ref 30.0–36.0)
MCV: 92.2 fL (ref 78.0–100.0)
PLATELETS: 197 10*3/uL (ref 150–400)
RBC: 3.35 MIL/uL — ABNORMAL LOW (ref 3.87–5.11)
RDW: 12.4 % (ref 11.5–15.5)
WBC: 7.5 10*3/uL (ref 4.0–10.5)

## 2017-10-26 NOTE — Care Management Note (Signed)
Case Management Note  Patient Details  Name: Kathleen Obrien MRN: [id number] Date of Birth: 12/15/1955  Subjective/Objective:   Right TKA                Action/Plan: NCM spoke to pt and offered choice for HH/list provided. [facility] (pre[facility] arranged from surgeon's office) Pt states she has RW at home. Requested 3n1 bariatric for home. Contacted [facility] for 3n1 bc to be delivered to room prior to dc.   Expected Discharge Date:  10/26/17               Expected Discharge Plan:  Home w Home Health Services  In-House Referral:  NA  Discharge planning Services  CM Consult  Post Acute Care Choice:  Home Health Choice offered to:  Patient  DME Arranged:  3-N-1 DME Agency:  [facility]  HH Arranged:  PT HH Agency[facility]  Status of Service:  Completed, signed off  If discussed at [employer] Length of Stay Meetings, dates discussed:    Additional Comments:  Elliot CousinShavis, Myrtle Haller Ellen, RN 10/26/2017, 9:20 AM

## 2017-10-26 NOTE — Discharge Summary (Signed)
Patient ID: Kathleen Obrien MRN: [id number] DOB/AGE: [birth year] 62 y.o.  Admit date: 10/24/2017 Discharge date: 10/26/2017  Admission Diagnoses:  Principal Problem:   Primary osteoarthritis of right knee Active Problems:   Morbid obesity [facility](HCC)   Discharge Diagnoses:  Same  Past Medical History:  Diagnosis Date  . Coronary artery disease    per cardiac cath 03-01-2011 ([facility]) mild nonobstructive cad  . DJD (degenerative joint disease) of knee    right and left  . Hyperlipidemia   . Hypertension   . Pre-diabetes   . S/P gastric bypass 2018    Surgeries: Procedure(s): RIGHT TOTAL KNEE ARTHROPLASTY on 10/24/2017   Consultants:   Discharged Condition: Improved  Hospital Course: Kathleen Obrien is an 62 y.o. female who was admitted 10/24/2017 for operative treatment ofPrimary osteoarthritis of right knee. Patient has severe unremitting pain that affects sleep, daily activities, and work/hobbies. After pre-op clearance the patient was taken to the operating room on 10/24/2017 and underwent  Procedure(s): RIGHT TOTAL KNEE ARTHROPLASTY.    Patient was given perioperative antibiotics:  Anti-infectives (From admission, onward)   Start     Dose/Rate Route Frequency Ordered Stop   10/24/17 1630  ceFAZolin (ANCEF) IVPB 2g/100 mL premix     2 g 200 mL/hr over 30 Minutes Intravenous Every 6 hours 10/24/17 1425 10/24/17 2213   10/24/17 0745  ceFAZolin (ANCEF) 3 g in dextrose 5 % 50 mL IVPB     3 g 100 mL/hr over 30 Minutes Intravenous On call to O.R. 10/24/17 0744 10/24/17 1040       Patient was given sequential compression devices, early ambulation, and chemoprophylaxis to prevent DVT.  Patient benefited maximally from hospital stay and there were no complications.    Recent vital signs:  Patient Vitals for the past 24 hrs:  BP Temp Temp src Pulse Resp SpO2  10/26/17 0537 121/73 97.6 F (36.4 C) Oral (!) 53 - 100 %  10/25/17 2127 (!) 141/83 98 F (36.7 C) Oral 61 16 100 %   10/25/17 1327 (!) 131/54 97.9 F (36.6 C) Oral (!) 56 16 100 %  10/25/17 1030 127/84 97.6 F (36.4 C) Oral 60 18 100 %     Recent laboratory studies:  Recent Labs    10/25/17 0510 10/26/17 0450  WBC 8.1 7.5  HGB 11.2* 10.5*  HCT 33.5* 30.9*  PLT 216 197  NA 140  --   K 4.5  --   CL 107  --   CO2 26  --   BUN 13  --   CREATININE 0.66  --   GLUCOSE 147*  --   CALCIUM 8.3*  --      Discharge Medications:   Allergies as of 10/26/2017      Reactions   Banana Nausea And Vomiting      Medication List    STOP taking these medications   meloxicam 15 MG tablet Commonly known as:  MOBIC   Oxycodone HCl 20 MG Tabs     TAKE these medications   aspirin EC 325 MG tablet Take 1 tablet (325 mg total) by mouth 2 (two) times daily after a meal. Take x 1 month post op to decrease risk of blood clots.   atenolol 50 MG tablet Commonly known as:  TENORMIN Take 50 mg by mouth 2 (two) times daily.   BARIATRIC MULTIVITAMINS/IRON PO Take 1 capsule by mouth daily.   calcium-vitamin D 250-100 MG-UNIT tablet Take 1 tablet by mouth 2 (two)  times daily.   diclofenac sodium 1 % Gel Commonly known as:  VOLTAREN Apply 2 g topically daily as needed (pain).   docusate sodium 100 MG capsule Commonly known as:  COLACE Take 1 capsule (100 mg total) by mouth 2 (two) times daily.   magnesium citrate Soln Take 1 Bottle by mouth once. Per pt as directed by md   NITROSTAT 0.4 MG SL tablet Generic drug:  nitroGLYCERIN Place 0.4 mg under the tongue every 5 (five) minutes as needed for chest pain.   omeprazole 20 MG capsule Commonly known as:  PRILOSEC Take 20 mg by mouth 2 (two) times daily.   oxyCODONE-acetaminophen 5-325 MG tablet Commonly known as:  PERCOCET/ROXICET Take 1-2 tablets by mouth every 4 (four) hours as needed for severe pain.   pravastatin 10 MG tablet Commonly known as:  PRAVACHOL Take 10 mg by mouth at bedtime.   tiZANidine 4 MG tablet Commonly known as:   ZANAFLEX Take 1 tablet (4 mg total) by mouth every 8 (eight) hours as needed for muscle spasms.            Discharge Care Instructions  (From admission, onward)        Start     Ordered   10/26/17 0000  Weight bearing as tolerated     10/26/17 0722      Diagnostic Studies: Dg Chest 2 View  Result Date: 10/21/2017 CLINICAL DATA:  62 year old female preoperative study for right knee surgery. Hypertension. EXAM: CHEST - 2 VIEW COMPARISON:  Chest radiographs 06/27/2007 and earlier. FINDINGS: Stable mediastinal contours, cardiac size at the upper limits of normal and mild tortuosity of the thoracic aorta. Visualized tracheal air column is within normal limits. Stable and normal lung volumes. No pneumothorax, pulmonary edema, pleural effusion or confluent pulmonary opacity. Severe chronic degenerative changes at both glenohumeral joints have progressed since 2009. Multilevel thoracic advanced disc and endplate degeneration, multiple levels of vacuum disc. No acute osseous abnormality identified. Negative visible bowel gas pattern. IMPRESSION: 1.  No acute cardiopulmonary abnormality. 2. 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 Electronically Signed   By: Odessa Fleming M.D.   On: 10/21/2017 07:54    Disposition: Discharge disposition: 01-Home or Self Care       Discharge Instructions    Call MD / Call 911   Complete by:  As directed    If you experience chest pain or shortness of breath, CALL 911 and be transported to the hospital emergency room.  If you develope a fever above 101 F, pus (white drainage) or increased drainage or redness at the wound, or calf pain, call your surgeon's office.   Constipation Prevention   Complete by:  As directed    Drink plenty of fluids.  Prune juice may be helpful.  You may use a stool softener, such as Colace (over the counter) 100 mg twice a day.  Use MiraLax (over the counter) for constipation as needed.   Diet - low sodium heart  healthy   Complete by:  As directed    Driving restrictions   Complete by:  As directed    No driving for 2 weeks   Increase activity slowly as tolerated   Complete by:  As directed    Patient may shower   Complete by:  As directed    You may shower without a dressing once there is no drainage.  Do not wash over the wound.  If drainage remains, cover wound with plastic wrap and then  shower.   Weight bearing as tolerated   Complete by:  As directed       Follow-up Information    Jodi Geralds, MD. Schedule an appointment as soon as possible for a visit in 2 weeks.   Specialty:  Orthopedic Surgery Contact information: [address] [place] [address] [phone]            Signed: Dannielle Burn 10/26/2017, 7:23 AM

## 2017-10-26 NOTE — Progress Notes (Signed)
Discharged from floor via w/c for transport home by car. Spouse & belongings with pt. No changes in assessment. Kathleen Obrien  

## 2017-10-26 NOTE — Progress Notes (Signed)
PATIENT ID: Kathleen Obrien  MRN: [id number]  DOB/AGE:  [birth year] / 62 y.o.  2 Days Post-Op Procedure(s) (LRB): RIGHT TOTAL KNEE ARTHROPLASTY (Right)    PROGRESS NOTE Subjective: Patient is alert, oriented, no Nausea, no Vomiting, yes passing gas. Taking PO well. Denies SOB, Chest or Calf Pain. Using Incentive Spirometer, PAS in place. Ambulate WBAT with pt walking 120 ft , Patient reports pain as 3/10 .    Objective: Vital signs in last 24 hours: Vitals:   10/25/17 1030 10/25/17 1327 10/25/17 2127 10/26/17 0537  BP: 127/84 (!) 131/54 (!) 141/83 121/73  Pulse: 60 (!) 56 61 (!) 53  Resp: 18 16 16    Temp: 97.6 F (36.4 C) 97.9 F (36.6 C) 98 F (36.7 C) 97.6 F (36.4 C)  TempSrc: Oral Oral Oral Oral  SpO2: 100% 100% 100% 100%  Weight:      Height:          Intake/Output from previous day: I/O last 3 completed shifts: In: 3171.7 [P.O.:980; I.V.:2191.7] Out: 700 [Urine:700]   Intake/Output this shift: No intake/output data recorded.   LABORATORY DATA: Recent Labs    10/24/17 1216 10/25/17 0510 10/26/17 0450  WBC  --  8.1 7.5  HGB  --  11.2* 10.5*  HCT  --  33.5* 30.9*  PLT  --  216 197  NA  --  140  --   K  --  4.5  --   CL  --  107  --   CO2  --  26  --   BUN  --  13  --   CREATININE  --  0.66  --   GLUCOSE  --  147*  --   GLUCAP 107*  --   --   CALCIUM  --  8.3*  --     Examination: Neurologically intact Neurovascular intact Sensation intact distally Intact pulses distally Dorsiflexion/Plantar flexion intact Incision: dressing C/D/I and no drainage No cellulitis present Compartment soft}  Assessment:   2 Days Post-Op Procedure(s) (LRB): RIGHT TOTAL KNEE ARTHROPLASTY (Right) ADDITIONAL DIAGNOSIS: Expected Acute Blood Loss Anemia, obesity Anticipated LOS equal to or greater than 2 midnights due to - Age 61 and older with one or more of the following:  - Obesity  - Expected need for hospital services (PT, OT, Nursing) required for safe   discharge  - Anticipated need for postoperative skilled nursing care or inpatient rehab  - Active co-morbidities: Chronic pain requiring opiods     Plan: PT/OT WBAT, AROM and PROM  DVT Prophylaxis:  SCDx72hrs, ASA 325 mg BID x 4 weeks DISCHARGE PLAN: Home later today after pt passes therapy goals DISCHARGE NEEDS: HHPT     Kathleen Obrien 10/26/2017, 7:19 AM

## 2017-10-26 NOTE — Progress Notes (Signed)
Physical Therapy Treatment Patient Details Name: Kathleen Obrien MRN: [id number] DOB: [birth year] Today's Date: 10/26/2017    History of Present Illness Pt is a 62 year old female s/p R TKA    PT Comments    Pt performed LE exercises and reviewed HEP handout.  Pt reports understanding.  Pt ready to d/c home today.  Follow Up Recommendations  Follow surgeon's recommendation for DC plan and follow-up therapies     Equipment Recommendations  None recommended by PT    Recommendations for Other Services       Precautions / Restrictions Precautions Precautions: Knee Precaution Comments: able to perform SLR Restrictions RLE Weight Bearing: Weight bearing as tolerated    Mobility    Balance   Cognition Arousal/Alertness: Awake/alert Behavior During Therapy: WFL for tasks assessed/performed Overall Cognitive Status: Within Functional Limits for tasks assessed                                        Exercises Total Joint Exercises Ankle Circles/Pumps: AROM;Both;10 reps Quad Sets: AROM;10 reps;Both Short Arc Quad: AROM;Right;10 reps Heel Slides: AAROM;10 reps;Right Hip ABduction/ADduction: AROM;Right;10 reps Straight Leg Raises: AROM;10 reps;Right    General Comments        Pertinent Vitals/Pain Pain Assessment: 0-10 Pain Score: 3  Pain Location: R knee Pain Descriptors / Indicators: Sore;Aching Pain Intervention(s): Limited activity within patient's tolerance;Repositioned;Monitored during session;Premedicated before session;Ice applied    Home Living                      Prior Function            PT Goals (current goals can now be found in the care plan section) Progress towards PT goals: Progressing toward goals    Frequency    7X/week      PT Plan Current plan remains appropriate    Co-evaluation              AM-PAC PT "6 Clicks" Daily Activity  Outcome Measure  Difficulty turning over in bed (including adjusting  bedclothes, sheets and blankets)?: A Little Difficulty moving from lying on back to sitting on the side of the bed? : A Little Difficulty sitting down on and standing up from a chair with arms (Obrien.g., wheelchair, bedside commode, etc,.)?: A Little Help needed moving to and from a bed to chair (including a wheelchair)?: A Little Help needed walking in hospital room?: A Little Help needed climbing 3-5 steps with a railing? : A Little 6 Click Score: 18    End of Session   Activity Tolerance: Patient tolerated treatment well Patient left: with call bell/phone within reach;in bed   PT Visit Diagnosis: Other abnormalities of gait and mobility (R26.89)     Time: 7829-56211114-1131 PT Time Calculation (min) (ACUTE ONLY): 17 min  Charges:  $Therapeutic Exercise: 8-22 mins                    G Codes:      Kathleen Obrien, PT, DPT 10/26/2017 Pager: [phone] Kathleen Obrien,Kathleen Obrien 10/26/2017, 1:48 PM

## 2017-10-26 NOTE — Care Management Important Message (Signed)
Important Message  Patient Details  Name: Kathleen Obrien MRN: [id number] Date of Birth: [birth year]   Medicare Important Message Given:  Yes    Elliot CousinShavis, Naylah Cork Ellen, RN 10/26/2017, 11:08 AM

## 2017-10-26 NOTE — Progress Notes (Signed)
Physical Therapy Treatment Patient Details Name: Kathleen Obrien MRN: [id number] DOB: 1956-04-13 Today's Date: 10/26/2017    History of Present Illness Pt is a 62 year old female s/p R TKA    PT Comments    Pt assisted with ambulating in hallway and practiced safe stair technique.  Pt reports understanding.  Pt requested using bathroom prior to return to bed.  Pt wished to "freshen up" once back in bed prior to reviewing exercises.    Follow Up Recommendations  Follow surgeon's recommendation for DC plan and follow-up therapies     Equipment Recommendations  None recommended by PT    Recommendations for Other Services       Precautions / Restrictions Precautions Precautions: Knee Precaution Comments: able to perform SLR Restrictions RLE Weight Bearing: Weight bearing as tolerated    Mobility  Bed Mobility Overal bed mobility: Needs Assistance Bed Mobility: Sit to Supine;Supine to Sit     Supine to sit: Supervision;HOB elevated Sit to supine: Supervision;HOB elevated   General bed mobility comments: increased time and effort  Transfers Overall transfer level: Needs assistance Equipment used: Rolling walker (2 wheeled) Transfers: Sit to/from Stand Sit to Stand: Min guard;Supervision         General transfer comment: verbal cues for safe technique  Ambulation/Gait Ambulation/Gait assistance: Min guard;Supervision Gait Distance (Feet): 80 Feet Assistive device: Rolling walker (2 wheeled) Gait Pattern/deviations: Decreased stance time - right;Step-to pattern;Antalgic     General Gait Details: verbal cues for RW positioning, step length   Stairs Stairs: Yes Stairs assistance: Min guard Stair Management: Step to pattern;Backwards;With walker Number of Stairs: 3 General stair comments: verbal cues for sequence, RW positioning, safety, pt reports understanding, declined practicing second time   Wheelchair Mobility    Modified Rankin (Stroke Patients  Only)       Balance                                            Cognition Arousal/Alertness: Awake/alert Behavior During Therapy: WFL for tasks assessed/performed Overall Cognitive Status: Within Functional Limits for tasks assessed                                        Exercises      General Comments        Pertinent Vitals/Pain Pain Assessment: 0-10 Pain Score: 3  Pain Location: R knee Pain Descriptors / Indicators: Sore;Aching Pain Intervention(s): Limited activity within patient's tolerance;Repositioned;Monitored during session;Premedicated before session;Ice applied    Home Living                      Prior Function            PT Goals (current goals can now be found in the care plan section) Progress towards PT goals: Progressing toward goals    Frequency    7X/week      PT Plan Current plan remains appropriate    Co-evaluation              AM-PAC PT "6 Clicks" Daily Activity  Outcome Measure  Difficulty turning over in bed (including adjusting bedclothes, sheets and blankets)?: A Little Difficulty moving from lying on back to sitting on the side of the bed? : A Little Difficulty sitting down  on and standing up from a chair with arms (Obrien.g., wheelchair, bedside commode, etc,.)?: A Little Help needed moving to and from a bed to chair (including a wheelchair)?: A Little Help needed walking in hospital room?: A Little Help needed climbing 3-5 steps with a railing? : A Little 6 Click Score: 18    End of Session   Activity Tolerance: Patient tolerated treatment well Patient left: with call bell/phone within reach;in bed   PT Visit Diagnosis: Other abnormalities of gait and mobility (R26.89)     Time: [id number] PT Time Calculation (min) (ACUTE ONLY): 18 min  Charges:  $Gait Training: 8-22 mins                    G Codes:       Kathleen Obrien, PT, DPT 10/26/2017 Pager: [phone] Kathleen Obrien,Kathleen Obrien 10/26/2017, 1:45 PM

## 2017-10-27 ENCOUNTER — Encounter (HOSPITAL_COMMUNITY): Payer: Self-pay | Admitting: Orthopedic Surgery

## 2018-01-07 ENCOUNTER — Other Ambulatory Visit: Payer: Self-pay | Admitting: Orthopedic Surgery

## 2018-01-21 NOTE — Patient Instructions (Addendum)
SHAKEYA KERKMAN  01/21/2018   Your procedure is scheduled on: 01-30-18   Report to [facility] Main  Entrance    Report to admitting at 7:30AM    Call this number if you have problems the morning of surgery [phone]    Remember: Do not eat food or drink liquids :After Midnight. BRUSH YOUR TEETH MORNING OF SURGERY AND RINSE YOUR MOUTH OUT, NO CHEWING GUM CANDY OR MINTS.     Take these medicines the morning of surgery with A SIP OF WATER: atenolol, omeprazole, duloxetine                                 You may not have any metal on your body including hair pins and              piercings  Do not wear jewelry, make-up, lotions, powders or perfumes, deodorant             Do not wear nail polish.  Do not shave  48 hours prior to surgery.              Do not bring valuables to the hospital. [facility] IS NOT             RESPONSIBLE   FOR VALUABLES.  Contacts, dentures or bridgework may not be worn into surgery.  Leave suitcase in the car. After surgery it may be brought to your room.                 Please read over the following fact sheets you were given: _____________________________________________________________________             [facility] - Preparing for Surgery Before surgery, you can play an important role.  Because skin is not sterile, your skin needs to be as free of germs as possible.  You can reduce the number of germs on your skin by washing with CHG (chlorahexidine gluconate) soap before surgery.  CHG is an antiseptic cleaner which kills germs and bonds with the skin to continue killing germs even after washing. Please DO NOT use if you have an allergy to CHG or antibacterial soaps.  If your skin becomes reddened/irritated stop using the CHG and inform your nurse when you arrive at Short Stay. Do not shave (including legs and underarms) for at least 48 hours prior to the first CHG shower.  You may shave your face/neck. Please follow  these instructions carefully:  1.  Shower with CHG Soap the night before surgery and the  morning of Surgery.  2.  If you choose to wash your hair, wash your hair first as usual with your  normal  shampoo.  3.  After you shampoo, rinse your hair and body thoroughly to remove the  shampoo.                           4.  Use CHG as you would any other liquid soap.  You can apply chg directly  to the skin and wash                       Gently with a scrungie or clean washcloth.  5.  Apply the CHG Soap to your body ONLY FROM THE NECK DOWN.   Do  not use on face/ open                           Wound or open sores. Avoid contact with eyes, ears mouth and genitals (private parts).                       Wash face,  Genitals (private parts) with your normal soap.             6.  Wash thoroughly, paying special attention to the area where your surgery  will be performed.  7.  Thoroughly rinse your body with warm water from the neck down.  8.  DO NOT shower/wash with your normal soap after using and rinsing off  the CHG Soap.                9.  Pat yourself dry with a clean towel.            10.  Wear clean pajamas.            11.  Place clean sheets on your bed the night of your first shower and do not  sleep with pets. Day of Surgery : Do not apply any lotions/deodorants the morning of surgery.  Please wear clean clothes to the hospital/surgery center.  FAILURE TO FOLLOW THESE INSTRUCTIONS MAY RESULT IN THE CANCELLATION OF YOUR SURGERY PATIENT SIGNATURE_________________________________  NURSE SIGNATURE__________________________________  ________________________________________________________________________   Adam Phenix  An incentive spirometer is a tool that can help keep your lungs clear and active. This tool measures how well you are filling your lungs with each breath. Taking long deep breaths may help reverse or decrease the chance of developing breathing (pulmonary) problems  (especially infection) following:  A long period of time when you are unable to move or be active. BEFORE THE PROCEDURE   If the spirometer includes an indicator to show your best effort, your nurse or respiratory therapist will set it to a desired goal.  If possible, sit up straight or lean slightly forward. Try not to slouch.  Hold the incentive spirometer in an upright position. INSTRUCTIONS FOR USE  1. Sit on the edge of your bed if possible, or sit up as far as you can in bed or on a chair. 2. Hold the incentive spirometer in an upright position. 3. Breathe out normally. 4. Place the mouthpiece in your mouth and seal your lips tightly around it. 5. Breathe in slowly and as deeply as possible, raising the piston or the ball toward the top of the column. 6. Hold your breath for 3-5 seconds or for as long as possible. Allow the piston or ball to fall to the bottom of the column. 7. Remove the mouthpiece from your mouth and breathe out normally. 8. Rest for a few seconds and repeat Steps 1 through 7 at least 10 times every 1-2 hours when you are awake. Take your time and take a few normal breaths between deep breaths. 9. The spirometer may include an indicator to show your best effort. Use the indicator as a goal to work toward during each repetition. 10. After each set of 10 deep breaths, practice coughing to be sure your lungs are clear. If you have an incision (the cut made at the time of surgery), support your incision when coughing by placing a pillow or rolled up towels firmly against it. Once you are able to get out of bed,  walk around indoors and cough well. You may stop using the incentive spirometer when instructed by your caregiver.  RISKS AND COMPLICATIONS  Take your time so you do not get dizzy or light-headed.  If you are in pain, you may need to take or ask for pain medication before doing incentive spirometry. It is harder to take a deep breath if you are having  pain. AFTER USE  Rest and breathe slowly and easily.  It can be helpful to keep track of a log of your progress. Your caregiver can provide you with a simple table to help with this. If you are using the spirometer at home, follow these instructions: [place] IF:   You are having difficultly using the spirometer.  You have trouble using the spirometer as often as instructed.  Your pain medication is not giving enough relief while using the spirometer.  You develop fever of 100.5 F (38.1 C) or higher. SEEK IMMEDIATE MEDICAL CARE IF:   You cough up bloody sputum that had not been present before.  You develop fever of 102 F (38.9 C) or greater.  You develop worsening pain at or near the incision site. MAKE SURE YOU:   Understand these instructions.  Will watch your condition.  Will get help right away if you are not doing well or get worse. Document Released: 08/19/2006 Document Revised: 07/01/2011 Document Reviewed: 10/20/2006 [facility] Patient Information 2014 [place], [place].   ________________________________________________________________________

## 2018-01-21 NOTE — Progress Notes (Signed)
EKG 10-20-17 epic   CXR 10-20-17 epic

## 2018-01-22 ENCOUNTER — Encounter (HOSPITAL_COMMUNITY)
Admission: RE | Admit: 2018-01-22 | Discharge: 2018-01-22 | Disposition: A | Discharge status: Home / Self Care | Payer: Medicare Other | Source: Ambulatory Visit | Attending: Orthopedic Surgery | Admitting: Orthopedic Surgery

## 2018-01-22 ENCOUNTER — Other Ambulatory Visit: Payer: Self-pay

## 2018-01-22 ENCOUNTER — Encounter (HOSPITAL_COMMUNITY): Payer: Self-pay

## 2018-01-22 DIAGNOSIS — M1712 Unilateral primary osteoarthritis, left knee: Secondary | ICD-10-CM | POA: Insufficient documentation

## 2018-01-22 DIAGNOSIS — Z01812 Encounter for preprocedural laboratory examination: Secondary | ICD-10-CM | POA: Insufficient documentation

## 2018-01-22 HISTORY — DX: Headache, unspecified: R51.9

## 2018-01-22 HISTORY — DX: Headache: R51

## 2018-01-22 LAB — CBC WITH DIFFERENTIAL/PLATELET
Basophils Absolute: 0 10*3/uL (ref 0.0–0.1)
Basophils Relative: 1 %
Eosinophils Absolute: 0 10*3/uL (ref 0.0–0.7)
Eosinophils Relative: 1 %
HEMATOCRIT: 40 % (ref 36.0–46.0)
HEMOGLOBIN: 13.3 g/dL (ref 12.0–15.0)
LYMPHS ABS: 1.8 10*3/uL (ref 0.7–4.0)
LYMPHS PCT: 50 %
MCH: 30.6 pg (ref 26.0–34.0)
MCHC: 33.3 g/dL (ref 30.0–36.0)
MCV: 92 fL (ref 78.0–100.0)
Monocytes Absolute: 0.2 10*3/uL (ref 0.1–1.0)
Monocytes Relative: 5 %
NEUTROS ABS: 1.5 10*3/uL — AB (ref 1.7–7.7)
NEUTROS PCT: 43 %
Platelets: 262 10*3/uL (ref 150–400)
RBC: 4.35 MIL/uL (ref 3.87–5.11)
RDW: 12.6 % (ref 11.5–15.5)
WBC: 3.6 10*3/uL — ABNORMAL LOW (ref 4.0–10.5)

## 2018-01-22 LAB — COMPREHENSIVE METABOLIC PANEL
ALT: 15 U/L (ref 0–44)
ANION GAP: 8 (ref 5–15)
AST: 23 U/L (ref 15–41)
Albumin: 4.3 g/dL (ref 3.5–5.0)
Alkaline Phosphatase: 61 U/L (ref 38–126)
BILIRUBIN TOTAL: 1.1 mg/dL (ref 0.3–1.2)
BUN: 13 mg/dL (ref 6–20)
CO2: 30 mmol/L (ref 22–32)
Calcium: 9.6 mg/dL (ref 8.9–10.3)
Chloride: 106 mmol/L (ref 98–111)
Creatinine, Ser: 0.66 mg/dL (ref 0.44–1.00)
GFR calc non Af Amer: >60 mL/min (ref >60)
Glucose, Bld: 102 mg/dL — ABNORMAL HIGH (ref 70–99)
Potassium: 3.7 mmol/L (ref 3.5–5.1)
Sodium: 144 mmol/L (ref 135–145)
TOTAL PROTEIN: 7.2 g/dL (ref 6.5–8.1)

## 2018-01-22 LAB — URINALYSIS, ROUTINE W REFLEX MICROSCOPIC
Bilirubin Urine: NEGATIVE
GLUCOSE, UA: NEGATIVE mg/dL
HGB URINE DIPSTICK: NEGATIVE
Ketones, ur: NEGATIVE mg/dL
LEUKOCYTES UA: NEGATIVE
Nitrite: NEGATIVE
Protein, ur: NEGATIVE mg/dL
SPECIFIC GRAVITY, URINE: 1.02 (ref 1.005–1.030)
pH: 6 (ref 5.0–8.0)

## 2018-01-22 LAB — PROTIME-INR
INR: 1.02
Prothrombin Time: 13.3 seconds (ref 11.4–15.2)

## 2018-01-22 LAB — SURGICAL PCR SCREEN
MRSA, PCR: NEGATIVE
STAPHYLOCOCCUS AUREUS: NEGATIVE

## 2018-01-22 LAB — APTT: APTT: 31 s (ref 24–36)

## 2018-01-29 NOTE — H&P (Addendum)
TOTAL KNEE ADMISSION H&P  Patient is being admitted for left total knee arthroplasty.  Subjective:  Chief Complaint:left knee pain.  HPI: Kathleen Obrien, 62 y.o. female, has a history of pain and functional disability in the left knee due to arthritis and has failed non-surgical conservative treatments for greater than 12 weeks to includeNSAID's and/or analgesics, corticosteriod injections, viscosupplementation injections, weight reduction as appropriate and activity modification.  Onset of symptoms was gradual, starting 5 years ago with gradually worsening course since that time. The patient noted no past surgery on the left knee(s).  Patient currently rates pain in the left knee(s) at 9 out of 10 with activity. Patient has night pain, worsening of pain with activity and weight bearing, pain that interferes with activities of daily living, pain with passive range of motion and joint swelling.  Patient has evidence of subchondral cysts, subchondral sclerosis, periarticular osteophytes, joint subluxation and joint space narrowing by imaging studies. This patient has had Failure of all reasonable conservative care. There is no active infection.  Patient Active Problem List   Diagnosis Date Noted  . Primary osteoarthritis of right knee 10/24/2017  . Morbid obesity ([place]) 10/24/2017  . Morbid obesity with BMI of 50.0-59.9, adult ([place]) 07/01/2013   Past Medical History:  Diagnosis Date  . Coronary artery disease    per cardiac cath 03-01-2011 (done at [facility]) mild nonobstructive cad  . DJD (degenerative joint disease) of knee    right and left  . Headache    for the past 2 days ," i think its because i dont have my glasses on all the time"   . Hyperlipidemia   . Hypertension   . Pre-diabetes    "im not prediabetic anymore, my dotor took me off the medicine because i lost weight and he said i didnt need it anymore"   . S/P gastric bypass 2018    Past Surgical History:  Procedure Laterality  Date  . CARDIAC CATHETERIZATION  03-01-2011   [facility]   mild CAD, normal LVF (pCFx 30%),  ef 65%  . CESAREAN SECTION  1980  . LAPAROSCOPIC GASTRIC SLEEVE RESECTION  2018   [facility]  . TOTAL KNEE ARTHROPLASTY Right 10/24/2017   Procedure: RIGHT TOTAL KNEE ARTHROPLASTY;  Surgeon: Dorna Leitz, MD;  Location: [facility];  Service: Orthopedics;  Laterality: Right;  Adductor Block  . VAGINAL HYSTERECTOMY  1988    No current facility-administered medications for this encounter.    Current Outpatient Medications  Medication Sig Dispense Refill Last Dose  . atenolol (TENORMIN) 50 MG tablet Take 50 mg by mouth 2 (two) times daily.    01/22/2018 at 0900  . calcium-vitamin D 250-100 MG-UNIT tablet Take 1 tablet by mouth daily.    01/22/2018 at Unknown time  . diclofenac sodium (VOLTAREN) 1 % GEL Apply 2 g topically daily as needed (pain).   Past Week at Unknown time  . DULoxetine (CYMBALTA) 30 MG capsule Take 30 mg by mouth 2 (two) times daily.   01/22/2018 at Unknown time  . Ibuprofen-diphenhydrAMINE Cit (ADVIL PM) 200-38 MG TABS Take 3 tablets by mouth at bedtime as needed (sleep).   Past Month at Unknown time  . meloxicam (MOBIC) 15 MG tablet Take 15 mg by mouth daily.  3 01/22/2018 at Unknown time  . Multiple Vitamins-Minerals (BARIATRIC MULTIVITAMINS/IRON PO) Take 1 capsule by mouth daily.   01/22/2018 at Unknown time  . omeprazole (PRILOSEC) 20 MG capsule Take 20 mg by mouth 2 (two) times daily.  3  01/22/2018 at Unknown time  . oxyCODONE-acetaminophen (PERCOCET/ROXICET) 5-325 MG tablet Take 1-2 tablets by mouth every 4 (four) hours as needed for severe pain. 60 tablet 0 Past Week at Unknown time  . pravastatin (PRAVACHOL) 10 MG tablet Take 10 mg by mouth at bedtime.  3 01/21/2018 at Unknown time  . tiZANidine (ZANAFLEX) 4 MG tablet Take 1 tablet (4 mg total) by mouth every 8 (eight) hours as needed for muscle spasms. (Patient taking differently: Take 4 mg by mouth every 8 (eight) hours. ) 40 tablet 0 01/21/2018 at  Unknown time  . aspirin EC 325 MG tablet Take 1 tablet (325 mg total) by mouth 2 (two) times daily after a meal. Take x 1 month post op to decrease risk of blood clots. (Patient not taking: Reported on 01/22/2018) 60 tablet 0 Not Taking at Unknown time  . docusate sodium (COLACE) 100 MG capsule Take 1 capsule (100 mg total) by mouth 2 (two) times daily. (Patient not taking: Reported on 01/22/2018) 30 capsule 0 Not Taking at Unknown time  . NITROSTAT 0.4 MG SL tablet Place 0.4 mg under the tongue every 5 (five) minutes as needed for chest pain.    Never needed   Allergies  Allergen Reactions  . Banana Nausea And Vomiting    Social History   Tobacco Use  . Smoking status: Never Smoker  . Smokeless tobacco: Never Used  Substance Use Topics  . Alcohol use: No    Family History  Problem Relation Age of Onset  . Arthritis Other      ROS ROS: I have reviewed the patient's review of systems thoroughly and there are no positive responses as relates to the HPI. Objective:  Physical Exam  Vital signs in last 24 hours:    Vitals:   01/30/18 0859 01/30/18 0900  BP:    Pulse: (!) 46 (!) 45  Resp: 15 (!) 6  Temp:    SpO2: 100% 100%   Well-developed well-nourished patient in no acute distress. Alert and oriented x3 HEENT:within normal limits Cardiac: Regular rate and rhythm Pulmonary: Lungs clear to auscultation Abdomen: Soft and nontender.  Normal active bowel sounds  Musculoskeletal: (Left knee: Limited range of motion.  Painful range of motion.  No instability.) Labs: Recent Results (from the past 2160 hour(s))  Surgical pcr screen     Status: None   Collection Time: 01/22/18  2:30 PM  Result Value Ref Range   MRSA, PCR NEGATIVE NEGATIVE   Staphylococcus aureus NEGATIVE NEGATIVE    Comment: (NOTE) The Xpert SA Assay (FDA approved for NASAL specimens in patients 63 years of age and older), is one component of a comprehensive surveillance program. It is not intended to diagnose  infection nor to guide or monitor treatment. Performed at [facility], [place] [address], [place], [place] [address]   APTT     Status: None   Collection Time: 01/22/18  2:30 PM  Result Value Ref Range   aPTT 31 24 - 36 seconds    Comment: Performed at [facility], [place] [address], [place], [place] [address]  CBC WITH DIFFERENTIAL     Status: Abnormal   Collection Time: 01/22/18  2:30 PM  Result Value Ref Range   WBC 3.6 (L) 4.0 - 10.5 K/uL   RBC 4.35 3.87 - 5.11 MIL/uL   Hemoglobin 13.3 12.0 - 15.0 g/dL   HCT 40.0 36.0 - 46.0 %   MCV 92.0 78.0 - 100.0 fL   MCH  30.6 26.0 - 34.0 pg   MCHC 33.3 30.0 - 36.0 g/dL   RDW 12.6 11.5 - 15.5 %   Platelets 262 150 - 400 K/uL   Neutrophils Relative % 43 %   Neutro Abs 1.5 (L) 1.7 - 7.7 K/uL   Lymphocytes Relative 50 %   Lymphs Abs 1.8 0.7 - 4.0 K/uL   Monocytes Relative 5 %   Monocytes Absolute 0.2 0.1 - 1.0 K/uL   Eosinophils Relative 1 %   Eosinophils Absolute 0.0 0.0 - 0.7 K/uL   Basophils Relative 1 %   Basophils Absolute 0.0 0.0 - 0.1 K/uL    Comment: Performed at [facility], [place] [address] [place], [place] [address]  Comprehensive metabolic panel     Status: Abnormal   Collection Time: 01/22/18  2:30 PM  Result Value Ref Range   Sodium 144 135 - 145 mmol/L   Potassium 3.7 3.5 - 5.1 mmol/L   Chloride 106 98 - 111 mmol/L   CO2 30 22 - 32 mmol/L   Glucose, Bld 102 (H) 70 - 99 mg/dL   BUN 13 6 - 20 mg/dL   Creatinine, Ser 0.66 0.44 - 1.00 mg/dL   Calcium 9.6 8.9 - 10.3 mg/dL   Total Protein 7.2 6.5 - 8.1 g/dL   Albumin 4.3 3.5 - 5.0 g/dL   AST 23 15 - 41 U/L   ALT 15 0 - 44 U/L   Alkaline Phosphatase 61 38 - 126 U/L   Total Bilirubin 1.1 0.3 - 1.2 mg/dL   GFR calc non Af Amer >60 >60 mL/min   GFR calc Af Amer >60 >60 mL/min    Comment: (NOTE) The eGFR has been calculated using the CKD EPI equation. This calculation has not been validated in all clinical  situations. eGFR's persistently <60 mL/min signify possible Chronic Kidney Disease.    Anion gap 8 5 - 15    Comment: Performed at [facility], [place] [address] [place], [place] [address]  Protime-INR     Status: None   Collection Time: 01/22/18  2:30 PM  Result Value Ref Range   Prothrombin Time 13.3 11.4 - 15.2 seconds   INR 1.02     Comment: Performed at [facility], [place] [address] [place], [place] [address]  Type and screen Order type and screen if day of surgery is less than 15 days from draw of preadmission visit or order morning of surgery if day of surgery is greater than 6 days from preadmission visit.     Status: None   Collection Time: 01/22/18  2:30 PM  Result Value Ref Range   ABO/RH(D) O POS    Antibody Screen NEG    Sample Expiration 02/05/2018    Extend sample reason      NO TRANSFUSIONS OR PREGNANCY IN THE PAST 3 MONTHS Performed at [facility], [place] [address], [place], [place] [address]   Urinalysis, Routine w reflex microscopic     Status: None   Collection Time: 01/22/18  2:30 PM  Result Value Ref Range   Color, Urine YELLOW YELLOW   APPearance CLEAR CLEAR   Specific Gravity, Urine 1.020 1.005 - 1.030   pH 6.0 5.0 - 8.0   Glucose, UA NEGATIVE NEGATIVE mg/dL   Hgb urine dipstick NEGATIVE NEGATIVE   Bilirubin Urine NEGATIVE NEGATIVE   Ketones, ur NEGATIVE NEGATIVE mg/dL   Protein, ur NEGATIVE NEGATIVE mg/dL   Nitrite NEGATIVE NEGATIVE   Leukocytes, UA  NEGATIVE NEGATIVE    Comment: Performed at [place] [address] [place], [place] [address]    Estimated body mass index is 38.92 kg/m as calculated from the following:   Height as of 01/22/18: 5' 8"  (1.727 m).   Weight as of 01/22/18: 116.1 kg.   Imaging Review Plain radiographs demonstrate severe degenerative joint disease of the left knee(s). The overall alignment ismild varus. The bone quality appears to be fair for  age and reported activity level.   Preoperative templating of the joint replacement has been completed, documented, and submitted to the Operating Room personnel in order to optimize intra-operative equipment management.   Anticipated LOS equal to or greater than 2 midnights due to - Age 77 and older with one or more of the following:  - Obesity  - Expected need for hospital services (PT, OT, Nursing) required for safe  discharge  - Anticipated need for postoperative skilled nursing care or inpatient rehab  - Active co-morbidities: Morbid obesity OR   - Unanticipated findings during/Post Surgery: Morbid obesity  - Patient is a high risk of re-admission due to: Long-term pain medication use inactivity and morbid obesity     Assessment/Plan:  End stage arthritis, left knee   The patient history, physical examination, clinical judgment of the provider and imaging studies are consistent with end stage degenerative joint disease of the left knee(s) and total knee arthroplasty is deemed medically necessary. The treatment options including medical management, injection therapy arthroscopy and arthroplasty were discussed at length. The risks and benefits of total knee arthroplasty were presented and reviewed. The risks due to aseptic loosening, infection, stiffness, patella tracking problems, thromboembolic complications and other imponderables were discussed. The patient acknowledged the explanation, agreed to proceed with the plan and consent was signed. Patient is being admitted for inpatient treatment for surgery, pain control, PT, OT, prophylactic antibiotics, VTE prophylaxis, progressive ambulation and ADL's and discharge planning. The patient is planning to be discharged home with home health services

## 2018-01-29 NOTE — Anesthesia Preprocedure Evaluation (Addendum)
Anesthesia Evaluation  Patient identified by MRN, date of birth, ID band Patient awake    Reviewed: Allergy & Precautions, H&P , NPO status , Patient's Chart, lab work & pertinent test results  Airway Mallampati: II  TM Distance: >3 FB Neck ROM: Full    Dental no notable dental hx. (+) Teeth Intact, Dental Advisory Given   Pulmonary neg pulmonary ROS,    Pulmonary exam normal breath sounds clear to auscultation       Cardiovascular hypertension, Pt. on medications + CAD  Normal cardiovascular exam Rhythm:Regular Rate:Normal     Neuro/Psych  Headaches, negative psych ROS   GI/Hepatic negative GI ROS, Neg liver ROS,   Endo/Other    Renal/GU negative Renal ROS     Musculoskeletal  (+) Arthritis ,   Abdominal (+) + obese,   Peds  Hematology   Anesthesia Other Findings   Reproductive/Obstetrics                            Lab Results  Component Value Date   WBC 3.6 (L) 01/22/2018   HGB 13.3 01/22/2018   HCT 40.0 01/22/2018   MCV 92.0 01/22/2018   PLT 262 01/22/2018   Lab Results  Component Value Date   CREATININE 0.66 01/22/2018   BUN 13 01/22/2018   NA 144 01/22/2018   K 3.7 01/22/2018   CL 106 01/22/2018   CO2 30 01/22/2018     Anesthesia Physical Anesthesia Plan  ASA: III  Anesthesia Plan: Spinal   Post-op Pain Management:    Induction: Intravenous  PONV Risk Score and Plan: 2 and Treatment may vary due to age or medical condition, Ondansetron and Dexamethasone  Airway Management Planned: Natural Airway and Nasal Cannula  Additional Equipment:   Intra-op Plan:   Post-operative Plan:   Informed Consent: I have reviewed the patients History and Physical, chart, labs and discussed the procedure including the risks, benefits and alternatives for the proposed anesthesia with the patient or authorized representative who has indicated his/her understanding and acceptance.    Dental advisory given  Plan Discussed with:   Anesthesia Plan Comments: (W L adductor canal block)       Anesthesia Quick Evaluation

## 2018-01-30 ENCOUNTER — Ambulatory Visit (HOSPITAL_COMMUNITY): Payer: Medicare Other | Admitting: Anesthesiology

## 2018-01-30 ENCOUNTER — Inpatient Hospital Stay (HOSPITAL_COMMUNITY)
Admission: RE | Admit: 2018-01-30 | Discharge: 2018-02-01 | DRG: 470 | Disposition: A | Discharge status: Home Health | Payer: Medicare Other | Attending: Orthopaedic Surgery | Admitting: Orthopaedic Surgery

## 2018-01-30 ENCOUNTER — Encounter (HOSPITAL_COMMUNITY): Payer: Self-pay

## 2018-01-30 ENCOUNTER — Encounter (HOSPITAL_COMMUNITY)
Admission: RE | Disposition: A | Discharge status: Home Health | Payer: Self-pay | Source: Home / Self Care | Attending: Orthopedic Surgery

## 2018-01-30 ENCOUNTER — Other Ambulatory Visit: Payer: Self-pay

## 2018-01-30 DIAGNOSIS — Z791 Long term (current) use of non-steroidal anti-inflammatories (NSAID): Secondary | ICD-10-CM

## 2018-01-30 DIAGNOSIS — Z8261 Family history of arthritis: Secondary | ICD-10-CM | POA: Diagnosis not present

## 2018-01-30 DIAGNOSIS — M1712 Unilateral primary osteoarthritis, left knee: Secondary | ICD-10-CM | POA: Diagnosis present

## 2018-01-30 DIAGNOSIS — Z96651 Presence of right artificial knee joint: Secondary | ICD-10-CM | POA: Diagnosis present

## 2018-01-30 DIAGNOSIS — Z01818 Encounter for other preprocedural examination: Secondary | ICD-10-CM

## 2018-01-30 DIAGNOSIS — I2510 Atherosclerotic heart disease of native coronary artery without angina pectoris: Secondary | ICD-10-CM | POA: Diagnosis present

## 2018-01-30 DIAGNOSIS — Z91018 Allergy to other foods: Secondary | ICD-10-CM

## 2018-01-30 DIAGNOSIS — R7303 Prediabetes: Secondary | ICD-10-CM | POA: Diagnosis present

## 2018-01-30 DIAGNOSIS — E785 Hyperlipidemia, unspecified: Secondary | ICD-10-CM | POA: Diagnosis present

## 2018-01-30 DIAGNOSIS — Z6841 Body Mass Index (BMI) 40.0 and over, adult: Secondary | ICD-10-CM | POA: Diagnosis not present

## 2018-01-30 DIAGNOSIS — I10 Essential (primary) hypertension: Secondary | ICD-10-CM | POA: Diagnosis present

## 2018-01-30 DIAGNOSIS — Z79899 Other long term (current) drug therapy: Secondary | ICD-10-CM

## 2018-01-30 HISTORY — PX: TOTAL KNEE ARTHROPLASTY: SHX125

## 2018-01-30 LAB — TYPE AND SCREEN
ABO/RH(D): O POS
Antibody Screen: NEGATIVE

## 2018-01-30 SURGERY — ARTHROPLASTY, KNEE, TOTAL
Anesthesia: Spinal | Site: Knee | Laterality: Left

## 2018-01-30 MED ORDER — SODIUM CHLORIDE 0.9 % IJ SOLN
INTRAMUSCULAR | Status: DC | PRN
  Administered 2018-01-30: 30 mL

## 2018-01-30 MED ORDER — CEFAZOLIN SODIUM-DEXTROSE 2-4 GM/100ML-% IV SOLN
INTRAVENOUS | Status: AC
  Filled 2018-01-30: qty 100

## 2018-01-30 MED ORDER — PROMETHAZINE HCL 25 MG/ML IJ SOLN: 6.2500 mg | INTRAMUSCULAR | Status: DC | PRN

## 2018-01-30 MED ORDER — PROPOFOL 10 MG/ML IV BOLUS
INTRAVENOUS | Status: AC
  Filled 2018-01-30: qty 20

## 2018-01-30 MED ORDER — POLYETHYLENE GLYCOL 3350 17 G PO PACK: 17.0000 g | PACK | Freq: Every day | ORAL | Status: DC | PRN

## 2018-01-30 MED ORDER — BISACODYL 5 MG PO TBEC: 5.0000 mg | DELAYED_RELEASE_TABLET | Freq: Every day | ORAL | Status: DC | PRN

## 2018-01-30 MED ORDER — CEFAZOLIN SODIUM-DEXTROSE 2-4 GM/100ML-% IV SOLN
2.0000 g | Freq: Four times a day (QID) | INTRAVENOUS | Status: AC
  Administered 2018-01-30 (×2): 2 g via INTRAVENOUS
  Filled 2018-01-30 (×2): qty 100

## 2018-01-30 MED ORDER — ASPIRIN EC 325 MG PO TBEC: 325.0000 mg | DELAYED_RELEASE_TABLET | Freq: Two times a day (BID) | ORAL | 0 refills | Status: DC

## 2018-01-30 MED ORDER — ONDANSETRON HCL 4 MG/2ML IJ SOLN: 4.0000 mg | Freq: Four times a day (QID) | INTRAMUSCULAR | Status: DC | PRN

## 2018-01-30 MED ORDER — DEXAMETHASONE SODIUM PHOSPHATE 10 MG/ML IJ SOLN
INTRAMUSCULAR | Status: DC | PRN
  Administered 2018-01-30: 10 mg via INTRAVENOUS

## 2018-01-30 MED ORDER — SODIUM CHLORIDE 0.9 % IR SOLN
Status: DC | PRN
  Administered 2018-01-30: 1000 mL

## 2018-01-30 MED ORDER — TRANEXAMIC ACID 1000 MG/10ML IV SOLN
1000.0000 mg | INTRAVENOUS | Status: AC
  Administered 2018-01-30: 1000 mg via INTRAVENOUS
  Filled 2018-01-30: qty 1000

## 2018-01-30 MED ORDER — BUPIVACAINE LIPOSOME 1.3 % IJ SUSP
20.0000 mL | Freq: Once | INTRAMUSCULAR | Status: DC
  Filled 2018-01-30: qty 20

## 2018-01-30 MED ORDER — HYDROMORPHONE HCL 1 MG/ML IJ SOLN: 0.2500 mg | INTRAMUSCULAR | Status: DC | PRN

## 2018-01-30 MED ORDER — DIPHENHYDRAMINE HCL 12.5 MG/5ML PO ELIX: 12.5000 mg | ORAL_SOLUTION | ORAL | Status: DC | PRN

## 2018-01-30 MED ORDER — PROPOFOL 10 MG/ML IV BOLUS
INTRAVENOUS | Status: AC
  Filled 2018-01-30: qty 60

## 2018-01-30 MED ORDER — ATENOLOL 50 MG PO TABS
50.0000 mg | ORAL_TABLET | Freq: Two times a day (BID) | ORAL | Status: DC
  Administered 2018-01-30 – 2018-02-01 (×4): 50 mg via ORAL
  Filled 2018-01-30 (×4): qty 1

## 2018-01-30 MED ORDER — SODIUM CHLORIDE 0.9 % IV SOLN
INTRAVENOUS | Status: DC | PRN
  Administered 2018-01-30: 15 ug/min via INTRAVENOUS

## 2018-01-30 MED ORDER — METHOCARBAMOL 500 MG PO TABS
500.0000 mg | ORAL_TABLET | Freq: Four times a day (QID) | ORAL | Status: DC | PRN
  Administered 2018-01-30 – 2018-02-01 (×5): 500 mg via ORAL
  Filled 2018-01-30 (×5): qty 1

## 2018-01-30 MED ORDER — MAGNESIUM CITRATE PO SOLN: 1.0000 | Freq: Once | ORAL | Status: DC | PRN

## 2018-01-30 MED ORDER — PROPOFOL 10 MG/ML IV BOLUS
INTRAVENOUS | Status: DC | PRN
  Administered 2018-01-30: 20 mg via INTRAVENOUS
  Administered 2018-01-30: 10 mg via INTRAVENOUS
  Administered 2018-01-30: 20 mg via INTRAVENOUS

## 2018-01-30 MED ORDER — HYDROMORPHONE HCL 1 MG/ML IJ SOLN
0.5000 mg | INTRAMUSCULAR | Status: DC | PRN
  Administered 2018-01-30 – 2018-01-31 (×2): 1 mg via INTRAVENOUS
  Filled 2018-01-30 (×2): qty 1

## 2018-01-30 MED ORDER — ONDANSETRON HCL 4 MG/2ML IJ SOLN
INTRAMUSCULAR | Status: DC | PRN
  Administered 2018-01-30: 4 mg via INTRAVENOUS

## 2018-01-30 MED ORDER — TRANEXAMIC ACID-NACL 1000-0.7 MG/100ML-% IV SOLN
1000.0000 mg | Freq: Once | INTRAVENOUS | Status: AC
  Administered 2018-01-30: 1000 mg via INTRAVENOUS
  Filled 2018-01-30: qty 100

## 2018-01-30 MED ORDER — OXYCODONE-ACETAMINOPHEN 5-325 MG PO TABS: 1.0000 | ORAL_TABLET | Freq: Four times a day (QID) | ORAL | 0 refills | Status: DC | PRN

## 2018-01-30 MED ORDER — FENTANYL CITRATE (PF) 100 MCG/2ML IJ SOLN
INTRAMUSCULAR | Status: AC
  Filled 2018-01-30: qty 2

## 2018-01-30 MED ORDER — MIDAZOLAM HCL 2 MG/2ML IJ SOLN
1.0000 mg | INTRAMUSCULAR | Status: DC | PRN
  Administered 2018-01-30: 2 mg via INTRAVENOUS

## 2018-01-30 MED ORDER — BUPIVACAINE-EPINEPHRINE 0.5% -1:200000 IJ SOLN
INTRAMUSCULAR | Status: DC | PRN
  Administered 2018-01-30: 30 mL

## 2018-01-30 MED ORDER — PRAVASTATIN SODIUM 20 MG PO TABS
10.0000 mg | ORAL_TABLET | Freq: Every day | ORAL | Status: DC
  Administered 2018-01-30 – 2018-01-31 (×2): 10 mg via ORAL
  Filled 2018-01-30 (×2): qty 1

## 2018-01-30 MED ORDER — STERILE WATER FOR IRRIGATION IR SOLN
Status: DC | PRN
  Administered 2018-01-30: 2000 mL

## 2018-01-30 MED ORDER — DOCUSATE SODIUM 100 MG PO CAPS: 100.0000 mg | ORAL_CAPSULE | Freq: Two times a day (BID) | ORAL | 0 refills | Status: DC

## 2018-01-30 MED ORDER — BUPIVACAINE-EPINEPHRINE (PF) 0.5% -1:200000 IJ SOLN
INTRAMUSCULAR | Status: AC
  Filled 2018-01-30: qty 30

## 2018-01-30 MED ORDER — HYDROCODONE-ACETAMINOPHEN 7.5-325 MG PO TABS: 1.0000 | ORAL_TABLET | Freq: Once | ORAL | Status: DC | PRN

## 2018-01-30 MED ORDER — ROPIVACAINE HCL 5 MG/ML IJ SOLN
INTRAMUSCULAR | Status: DC | PRN
  Administered 2018-01-30: 30 mL via PERINEURAL

## 2018-01-30 MED ORDER — SODIUM CHLORIDE 0.9 % IV SOLN
INTRAVENOUS | Status: DC
  Administered 2018-01-30 – 2018-01-31 (×2): via INTRAVENOUS

## 2018-01-30 MED ORDER — CEFAZOLIN SODIUM-DEXTROSE 2-4 GM/100ML-% IV SOLN
2.0000 g | INTRAVENOUS | Status: AC
  Administered 2018-01-30: 2 g via INTRAVENOUS

## 2018-01-30 MED ORDER — ONDANSETRON HCL 4 MG PO TABS: 4.0000 mg | ORAL_TABLET | Freq: Four times a day (QID) | ORAL | Status: DC | PRN

## 2018-01-30 MED ORDER — LACTATED RINGERS IV SOLN
INTRAVENOUS | Status: DC
  Administered 2018-01-30: 1000 mL via INTRAVENOUS
  Administered 2018-01-30: 11:00:00 via INTRAVENOUS

## 2018-01-30 MED ORDER — FENTANYL CITRATE (PF) 100 MCG/2ML IJ SOLN
50.0000 ug | INTRAMUSCULAR | Status: DC | PRN
  Administered 2018-01-30: 100 ug via INTRAVENOUS

## 2018-01-30 MED ORDER — NITROGLYCERIN 0.4 MG SL SUBL: 0.4000 mg | SUBLINGUAL_TABLET | SUBLINGUAL | Status: DC | PRN

## 2018-01-30 MED ORDER — MEPERIDINE HCL 50 MG/ML IJ SOLN: 6.2500 mg | INTRAMUSCULAR | Status: DC | PRN

## 2018-01-30 MED ORDER — CHLORHEXIDINE GLUCONATE 4 % EX LIQD: 60.0000 mL | Freq: Once | CUTANEOUS | Status: DC

## 2018-01-30 MED ORDER — GLYCOPYRROLATE PF 0.2 MG/ML IJ SOSY
PREFILLED_SYRINGE | INTRAMUSCULAR | Status: DC | PRN
  Administered 2018-01-30 (×2): .1 mg via INTRAVENOUS

## 2018-01-30 MED ORDER — METHOCARBAMOL 500 MG IVPB - SIMPLE MED
500.0000 mg | Freq: Four times a day (QID) | INTRAVENOUS | Status: DC | PRN
  Filled 2018-01-30: qty 50

## 2018-01-30 MED ORDER — DULOXETINE HCL 30 MG PO CPEP
30.0000 mg | ORAL_CAPSULE | Freq: Two times a day (BID) | ORAL | Status: DC
  Administered 2018-01-30 – 2018-02-01 (×4): 30 mg via ORAL
  Filled 2018-01-30 (×4): qty 1

## 2018-01-30 MED ORDER — DOCUSATE SODIUM 100 MG PO CAPS
100.0000 mg | ORAL_CAPSULE | Freq: Two times a day (BID) | ORAL | Status: DC
  Administered 2018-01-30 – 2018-02-01 (×4): 100 mg via ORAL
  Filled 2018-01-30 (×4): qty 1

## 2018-01-30 MED ORDER — DEXAMETHASONE SODIUM PHOSPHATE 10 MG/ML IJ SOLN
10.0000 mg | Freq: Two times a day (BID) | INTRAMUSCULAR | Status: AC
  Administered 2018-01-30 – 2018-01-31 (×3): 10 mg via INTRAVENOUS
  Filled 2018-01-30 (×3): qty 1

## 2018-01-30 MED ORDER — CELECOXIB 200 MG PO CAPS
200.0000 mg | ORAL_CAPSULE | Freq: Two times a day (BID) | ORAL | Status: DC
  Administered 2018-01-30 – 2018-02-01 (×4): 200 mg via ORAL
  Filled 2018-01-30 (×6): qty 1

## 2018-01-30 MED ORDER — BUPIVACAINE IN DEXTROSE 0.75-8.25 % IT SOLN
INTRATHECAL | Status: DC | PRN
  Administered 2018-01-30: 1.8 mL via INTRATHECAL

## 2018-01-30 MED ORDER — 0.9 % SODIUM CHLORIDE (POUR BTL) OPTIME
TOPICAL | Status: DC | PRN
  Administered 2018-01-30: 1000 mL

## 2018-01-30 MED ORDER — PROPOFOL 500 MG/50ML IV EMUL
INTRAVENOUS | Status: DC | PRN
  Administered 2018-01-30: 70 ug/kg/min via INTRAVENOUS

## 2018-01-30 MED ORDER — BUPIVACAINE LIPOSOME 1.3 % IJ SUSP
INTRAMUSCULAR | Status: DC | PRN
  Administered 2018-01-30: 20 mL

## 2018-01-30 MED ORDER — MIDAZOLAM HCL 2 MG/2ML IJ SOLN
INTRAMUSCULAR | Status: AC
  Filled 2018-01-30: qty 2

## 2018-01-30 MED ORDER — PANTOPRAZOLE SODIUM 40 MG PO TBEC
40.0000 mg | DELAYED_RELEASE_TABLET | Freq: Every day | ORAL | Status: DC
  Administered 2018-01-30 – 2018-02-01 (×3): 40 mg via ORAL
  Filled 2018-01-30 (×2): qty 1

## 2018-01-30 MED ORDER — ASPIRIN EC 325 MG PO TBEC
325.0000 mg | DELAYED_RELEASE_TABLET | Freq: Two times a day (BID) | ORAL | Status: DC
  Administered 2018-01-30 – 2018-02-01 (×4): 325 mg via ORAL
  Filled 2018-01-30 (×4): qty 1

## 2018-01-30 MED ORDER — SODIUM CHLORIDE 0.9 % IJ SOLN
INTRAMUSCULAR | Status: AC
  Filled 2018-01-30: qty 50

## 2018-01-30 MED ORDER — GABAPENTIN 300 MG PO CAPS
300.0000 mg | ORAL_CAPSULE | Freq: Two times a day (BID) | ORAL | Status: DC
  Administered 2018-01-30 – 2018-02-01 (×4): 300 mg via ORAL
  Filled 2018-01-30 (×4): qty 1

## 2018-01-30 MED ORDER — ALUM & MAG HYDROXIDE-SIMETH 200-200-20 MG/5ML PO SUSP: 30.0000 mL | ORAL | Status: DC | PRN

## 2018-01-30 MED ORDER — ACETAMINOPHEN 10 MG/ML IV SOLN: 1000.0000 mg | Freq: Once | INTRAVENOUS | Status: DC | PRN

## 2018-01-30 MED ORDER — OXYCODONE HCL 5 MG PO TABS
5.0000 mg | ORAL_TABLET | ORAL | Status: DC | PRN
  Administered 2018-01-30 – 2018-01-31 (×7): 10 mg via ORAL
  Administered 2018-02-01: 5 mg via ORAL
  Administered 2018-02-01: 10 mg via ORAL
  Filled 2018-01-30: qty 2
  Filled 2018-01-30: qty 1
  Filled 2018-01-30 (×7): qty 2

## 2018-01-30 MED ORDER — ACETAMINOPHEN 325 MG PO TABS: 325.0000 mg | ORAL_TABLET | Freq: Four times a day (QID) | ORAL | Status: DC | PRN

## 2018-01-30 SURGICAL SUPPLY — 64 items
APL SKNCLS STERI-STRIP NONHPOA (GAUZE/BANDAGES/DRESSINGS) ×1
ATTUNE MED DOME PAT 38 KNEE (Knees) ×1 IMPLANT
ATTUNE MED DOME PAT 38MM KNEE (Knees) ×1 IMPLANT
ATTUNE PS FEM LT SZ 7 CEM KNEE (Femur) ×2 IMPLANT
ATTUNE PSRP INSR SZ7 7 KNEE (Insert) ×1 IMPLANT
ATTUNE PSRP INSR SZ7 7MM KNEE (Insert) ×1 IMPLANT
BAG SPEC THK2 15X12 ZIP CLS (MISCELLANEOUS) ×1
BAG ZIPLOCK 12X15 (MISCELLANEOUS) ×3 IMPLANT
BANDAGE ACE 6X5 VEL STRL LF (GAUZE/BANDAGES/DRESSINGS) ×3 IMPLANT
BASE TIBIAL ROT PLAT SZ 8 KNEE (Knees) IMPLANT
BENZOIN TINCTURE PRP APPL 2/3 (GAUZE/BANDAGES/DRESSINGS) ×3 IMPLANT
BLADE SAGITTAL 25.0X1.19X90 (BLADE) ×2 IMPLANT
BLADE SAGITTAL 25.0X1.19X90MM (BLADE) ×1
BLADE SAW SGTL 11.0X1.19X90.0M (BLADE) ×3 IMPLANT
BNDG CMPR 82X61 PLY HI ABS (GAUZE/BANDAGES/DRESSINGS) ×1
BNDG CONFORM 6X.82 1P STRL (GAUZE/BANDAGES/DRESSINGS) ×2 IMPLANT
BOOTIES KNEE HIGH SLOAN (MISCELLANEOUS) ×3 IMPLANT
BOWL SMART MIX CTS (DISPOSABLE) ×3 IMPLANT
BSPLAT TIB 8 CMNT ROT PLAT STR (Knees) ×1 IMPLANT
CEMENT HV SMART SET (Cement) ×6 IMPLANT
CLOSURE WOUND 1/2 X4 (GAUZE/BANDAGES/DRESSINGS)
COVER WAND RF STERILE (DRAPES) IMPLANT
CUFF TOURN SGL QUICK 34 (TOURNIQUET CUFF) ×3
CUFF TRNQT CYL 34X4X40X1 (TOURNIQUET CUFF) ×1 IMPLANT
DECANTER SPIKE VIAL GLASS SM (MISCELLANEOUS) IMPLANT
DRAPE U-SHAPE 47X51 STRL (DRAPES) ×3 IMPLANT
DRSG AQUACEL AG ADV 3.5X10 (GAUZE/BANDAGES/DRESSINGS) ×3 IMPLANT
DURAPREP 26ML APPLICATOR (WOUND CARE) ×3 IMPLANT
ELECT REM PT RETURN 15FT ADLT (MISCELLANEOUS) ×3 IMPLANT
GLOVE BIOGEL PI IND STRL 8 (GLOVE) ×2 IMPLANT
GLOVE BIOGEL PI INDICATOR 8 (GLOVE) ×4
GLOVE ECLIPSE 7.5 STRL STRAW (GLOVE) ×6 IMPLANT
GOWN STRL REUS W/TWL XL LVL3 (GOWN DISPOSABLE) ×6 IMPLANT
HANDPIECE INTERPULSE COAX TIP (DISPOSABLE) ×3
HOLDER FOLEY CATH W/STRAP (MISCELLANEOUS) IMPLANT
HOOD PEEL AWAY FLYTE STAYCOOL (MISCELLANEOUS) ×9 IMPLANT
IMMOBILIZER KNEE 20 (SOFTGOODS) ×3
IMMOBILIZER KNEE 20 THIGH 36 (SOFTGOODS) ×1 IMPLANT
MANIFOLD NEPTUNE II (INSTRUMENTS) ×3 IMPLANT
NDL SAFETY ECLIPSE 18X1.5 (NEEDLE) IMPLANT
NEEDLE HYPO 18GX1.5 SHARP (NEEDLE)
NEEDLE HYPO 22GX1.5 SAFETY (NEEDLE) ×3 IMPLANT
NS IRRIG 1000ML POUR BTL (IV SOLUTION) ×3 IMPLANT
PACK ICE MAXI GEL EZY WRAP (MISCELLANEOUS) ×3 IMPLANT
PACK TOTAL KNEE CUSTOM (KITS) ×3 IMPLANT
PADDING CAST COTTON 6X4 STRL (CAST SUPPLIES) ×3 IMPLANT
POSITIONER SURGICAL ARM (MISCELLANEOUS) ×3 IMPLANT
SET HNDPC FAN SPRY TIP SCT (DISPOSABLE) ×1 IMPLANT
STAPLER VISISTAT 35W (STAPLE) IMPLANT
STRIP CLOSURE SKIN 1/2X4 (GAUZE/BANDAGES/DRESSINGS) IMPLANT
SUT MNCRL AB 3-0 PS2 18 (SUTURE) ×3 IMPLANT
SUT VIC AB 0 CT1 36 (SUTURE) ×3 IMPLANT
SUT VIC AB 1 CT1 36 (SUTURE) ×8 IMPLANT
SUT VIC AB 2-0 CT1 27 (SUTURE) ×6
SUT VIC AB 2-0 CT1 TAPERPNT 27 (SUTURE) ×2 IMPLANT
SYR 3ML LL SCALE MARK (SYRINGE) IMPLANT
SYR CONTROL 10ML LL (SYRINGE) ×6 IMPLANT
TAPE STRIPS DRAPE STRL (GAUZE/BANDAGES/DRESSINGS) ×2 IMPLANT
TIBIAL BASE ROT PLAT SZ 8 KNEE (Knees) ×3 IMPLANT
TOWEL OR NON WOVEN STRL DISP B (DISPOSABLE) ×3 IMPLANT
TRAY FOLEY MTR SLVR 16FR STAT (SET/KITS/TRAYS/PACK) ×3 IMPLANT
WATER STERILE IRR 1000ML POUR (IV SOLUTION) ×6 IMPLANT
WRAP KNEE MAXI GEL POST OP (GAUZE/BANDAGES/DRESSINGS) ×2 IMPLANT
YANKAUER SUCT BULB TIP 10FT TU (MISCELLANEOUS) ×3 IMPLANT

## 2018-01-30 NOTE — Op Note (Signed)
NAME: Kathleen Obrien, Kathleen Obrien MEDICAL RECORD VH:[id number] ACCOUNT [id number] DATE OF BIRTH:1955-07-28 FACILITY: [facility] LOCATION: [facility] PHYSICIAN:Dahna Hattabaugh L. Meghna Hagmann, MD  OPERATIVE REPORT  DATE OF PROCEDURE:  01/30/2018  PREOPERATIVE DIAGNOSIS:  End-stage degenerative joint disease with severe arthritis, left knee.  POSTOPERATIVE DIAGNOSIS:  End-stage degenerative joint disease with severe arthritis, left knee.  PROCEDURE:  Left total knee replacement with an Attune system, size 7 femur, size 8 tibia, 7 mm bridging bearing, 38 mm all polyethylene patella.  SURGEON:  Jodi Geralds, MD  ASSISTANT:  Gus Puma PA-C  ANESTHESIA:  Spinal.  BRIEF HISTORY:  The patient is a 62 year old female with a long history of significant complaints of bilateral knee pain.  We had done a right total knee.  She did great with that.  After left knee was limiting her recovery, she felt that she needed a  left total knee done.  She was brought to the operating room for this procedure.  The patient was having night pain and light activity pain.  She had severe bone-on-bone change on x-ray.  DESCRIPTION OF PROCEDURE:  The patient was brought to the operating room.  After adequate anesthesia was obtained with a spinal anesthetic, the patient was placed supine on the operating table.  Left leg was prepped and draped in the usual sterile  fashion.  Following this, the leg was exsanguinated, blood pressure tourniquet inflated to 350 mmHg.  Following this, a midline incision was made in the subcutaneous tissue down to the extensor mechanism.  A medial parapatellar arthrotomy was undertaken.   Following this, medial and lateral meniscus were removed, retropatellar fat pad, synovium on the anterior aspect of the femur, and the anterior and posterior cruciates.  Following this, a pilot hole was made in the femur.  The femur was cut with a  4-degree valgus inclination, 9 mm of distal bone.  Attention was then turned towards the  sizing of the femur sized up to a 7.  Anterior and posterior cuts were made chamfers and box.  Attention was then turned to the tibia.  It was cut perpendicular to  its long axis.  At this point, we began the process of osteophyte removal.  We removed a tremendous amount of osteophyte on the medial tibia and dissected all the way around to the posterior horn and took osteophytes off the posterior tibia.  We then  went back up to the femur, and we did a posterior osteophyte excision off the medial femur so that he could get the balance right.  Once all this was done, we were able to make our final flat cut on the tibia, and the tibia was then sized to an 8.  It  was drilled and keeled.  Trial components were then put in place.  Excellent gap balance was achieved.  I had a 6 poly, but I felt 7 was going to be better.  We checked with a 7.  That seemed to be perfect.  We went to the patella and cut it down to a  level of 14 mm and used a 38 all poly patella, a trial, and then put a 38 all poly patella in place.  At this point, all trial components were removed.  The knee was copiously and thoroughly irrigated and suctioned dry.  The final components were  cemented into place, size 8 tibia, size 7 femur, 7 mm bridging bearing trial was placed and a 38 all poly patella was placed and held with a clamp.  All  excess bone cements were removed, and attention was then turned towards allowing the cement to  harden.  Exparel was placed throughout the synovial reflections for postoperative pain control.  The final poly was opened and placed.  The tourniquet was let down.  It did have 76 minutes of tourniquet time, which is a bit long, but it was all based on  osteophyte excision and gap balancing.  Once we got this done, we checked the balance again.  It was excellent balance.  The medial parapatellar arthrotomy was closed with 1 Vicryl running and skin with 0 and 2-0 Vicryl and skin staples.  A sterile  compressive  dressing was applied.  The patient was taken to the recovery room in satisfactory condition.  Estimated blood loss for the procedure was minimal.  LN/NUANCE  D:01/30/2018 T:01/30/2018 JOB:[id number]

## 2018-01-30 NOTE — Anesthesia Procedure Notes (Signed)
Spinal  Patient location during procedure: OB Start time: 01/30/2018 10:05 AM End time: 01/30/2018 10:15 AM Staffing Anesthesiologist: Trevor Iha, MD Performed: anesthesiologist  Preanesthetic Checklist Completed: patient identified, surgical consent, pre-op evaluation, timeout performed, IV checked, risks and benefits discussed and monitors and equipment checked Spinal Block Patient position: sitting Prep: site prepped and draped and DuraPrep Patient monitoring: heart rate, cardiac monitor, continuous pulse ox and blood pressure Approach: midline Location: L3-4 Injection technique: single-shot Needle Needle type: Pencan  Needle gauge: 24 G Needle length: 10 cm Needle insertion depth: 6 cm Assessment Sensory level: T4 Additional Notes 1 attempt . Pt tolerated procedure well.

## 2018-01-30 NOTE — Discharge Instructions (Signed)

## 2018-01-30 NOTE — Progress Notes (Signed)
AssistedDr. Houser with left, ultrasound guided, adductor canal block. Side rails up, monitors on throughout procedure. See vital signs in flow sheet. Tolerated Procedure well.  

## 2018-01-30 NOTE — Anesthesia Procedure Notes (Signed)
Anesthesia Regional Block: Adductor canal block   Pre-Anesthetic Checklist: ,, timeout performed, Correct Patient, Correct Site, Correct Laterality, Correct Procedure, Correct Position, site marked, Risks and benefits discussed,  Surgical consent,  Pre-op evaluation,  At surgeon's request and post-op pain management  Laterality: Left  Prep: Maximum Sterile Barrier Precautions used, chloraprep       Needles:  Injection technique: Single-shot  Needle Type: Echogenic Needle     Needle Length: 9cm  Needle Gauge: 21     Additional Needles:   Procedures:,,,, ultrasound used (permanent image in chart),,,,  Narrative:  Start time: 01/30/2018 8:38 AM End time: 01/30/2018 8:46 AM Injection made incrementally with aspirations every 5 mL.  Performed by: Personally  Anesthesiologist: Trevor Iha, MD  Additional Notes: ! Attempt. Patient tolerated procedure well.

## 2018-01-30 NOTE — Evaluation (Signed)
Physical Therapy Evaluation Patient Details Name: Kathleen Obrien MRN: [id number] DOB: 1956/02/24 Today's Date: 01/30/2018   History of Present Illness  62 YO female s/p L TKR on 01/30/18. PMH includes OA, CAD, HLD, pre-DM, obesity, gastric bypass, R TKR 10/2017.   Clinical Impression  Pt presents with L knee pain especially with ambulation, decreased tolerance for activity, difficulty performing mobility tasks. Pt to benefit from acute PT to address deficits. Pt ambulated room distance today, limited by L knee pain. PT to continue to follow and progress mobility as tolerated.     Follow Up Recommendations Follow surgeon's recommendation for DC plan and follow-up therapies;Supervision for mobility/OOB(HHPT )    Equipment Recommendations  None recommended by PT    Recommendations for Other Services       Precautions / Restrictions Precautions Precautions: Fall Required Braces or Orthoses: Knee Immobilizer - Left Knee Immobilizer - Left: Discontinue once straight leg raise with < 10 degree lag;Discontinue post op day 2 Restrictions Weight Bearing Restrictions: No Other Position/Activity Restrictions: WBAT       Mobility  Bed Mobility Overal bed mobility: Needs Assistance Bed Mobility: Supine to Sit     Supine to sit: Min assist;HOB elevated     General bed mobility comments: Min assist for LE management and scooting to EOB. Pt with self-steadying upon sitting on EOB. Increased time/effort  Transfers Overall transfer level: Needs assistance Equipment used: Rolling walker (2 wheeled) Transfers: Sit to/from Stand Sit to Stand: Min guard;From elevated surface         General transfer comment: Min guard for safety. Verbal cuing for hand placement.   Ambulation/Gait Ambulation/Gait assistance: Min guard Gait Distance (Feet): 10 Feet Assistive device: Rolling walker (2 wheeled) Gait Pattern/deviations: Step-to pattern;Decreased stride length;Antalgic;Decreased stance  time - left;Decreased weight shift to left;Trunk flexed Gait velocity: decr   General Gait Details: Pt with antalgic gait, limited ambulation distance. Pt min guard for safety. Verbal cuing for placement in RW, sequencing, placement of feet in RW to avoid placing feet too far forward.   Stairs            Wheelchair Mobility    Modified Rankin (Stroke Patients Only)       Balance Overall balance assessment: Mild deficits observed, not formally tested                                           Pertinent Vitals/Pain Pain Assessment: 0-10 Pain Score: 5  Pain Location: L knee Pain Descriptors / Indicators: Aching Pain Intervention(s): Limited activity within patient's tolerance;Repositioned;Ice applied;Monitored during session    Home Living Family/patient expects to be discharged to:: Private residence Living Arrangements: Spouse/significant other Available Help at Discharge: Family;Available PRN/intermittently Type of Home: House Home Access: Stairs to enter Entrance Stairs-Rails: Left Entrance Stairs-Number of Steps: 2 Home Layout: One level Home Equipment: Bedside commode;Walker - 2 wheels;Cane - single point;Grab bars - tub/shower      Prior Function Level of Independence: Independent               Hand Dominance   Dominant Hand: Right    Extremity/Trunk Assessment   Upper Extremity Assessment Upper Extremity Assessment: Overall WFL for tasks assessed    Lower Extremity Assessment Lower Extremity Assessment: Overall WFL for tasks assessed;LLE deficits/detail LLE Deficits / Details: able to perform weak quad set, ankle pumps, SLR with assist  Cervical / Trunk Assessment Cervical / Trunk Assessment: Normal  Communication   Communication: No difficulties  Cognition Arousal/Alertness: Awake/alert Behavior During Therapy: WFL for tasks assessed/performed Overall Cognitive Status: Within Functional Limits for tasks assessed                                         General Comments      Exercises Total Joint Exercises Ankle Circles/Pumps: AROM;Both;5 reps;Supine   Assessment/Plan    PT Assessment Patient needs continued PT services  PT Problem List Decreased strength;Pain;Decreased range of motion       PT Treatment Interventions DME instruction;Therapeutic activities;Gait training;Therapeutic exercise;Patient/family education;Stair training;Balance training;Functional mobility training    PT Goals (Current goals can be found in the Care Plan section)  Acute Rehab PT Goals PT Goal Formulation: With patient Time For Goal Achievement: 02/13/18 Potential to Achieve Goals: Good    Frequency 7X/week   Barriers to discharge        Co-evaluation               AM-PAC PT "6 Clicks" Daily Activity  Outcome Measure Difficulty turning over in bed (including adjusting bedclothes, sheets and blankets)?: Unable Difficulty moving from lying on back to sitting on the side of the bed? : Unable Difficulty sitting down on and standing up from a chair with arms (e.g., wheelchair, bedside commode, etc,.)?: Unable Help needed moving to and from a bed to chair (including a wheelchair)?: A Little Help needed walking in hospital room?: A Little Help needed climbing 3-5 steps with a railing? : A Little 6 Click Score: 12    End of Session Equipment Utilized During Treatment: Gait belt;Left knee immobilizer Activity Tolerance: Patient limited by pain Patient left: in chair;with chair alarm set;with call bell/phone within reach;with family/visitor present;with SCD's reapplied Nurse Communication: Mobility status PT Visit Diagnosis: Difficulty in walking, not elsewhere classified (R26.2);Other abnormalities of gait and mobility (R26.89)    Time: 1610-9604 PT Time Calculation (min) (ACUTE ONLY): 27 min   Charges:   PT Evaluation $PT Eval Low Complexity: 1 Low PT Treatments $Gait Training: 8-22  mins       Nicola Police, PT Acute Rehabilitation Services Pager [phone]  Office [phone]   [name] 01/30/2018, 7:36 PM

## 2018-01-30 NOTE — Brief Op Note (Signed)
01/30/2018  12:10 PM  PATIENT:  Kathleen Obrien  62 y.o. female  PRE-OPERATIVE DIAGNOSIS:  OSTEOARTHRITIS LEFT KNEE  POST-OPERATIVE DIAGNOSIS:  OSTEOARTHRITIS LEFT KNEE  PROCEDURE:  Procedure(s): LEFT TOTAL KNEE ARTHROPLASTY (Left)  SURGEON:  Surgeon(s) and Role:    Jodi Geralds, MD - Primary  PHYSICIAN ASSISTANT:   ASSISTANTS: bethunepac    ANESTHESIA:   spinal  EBL: 50  BLOOD ADMINISTERED:none  DRAINS: none   LOCAL MEDICATIONS USED:  MARCAINE    and OTHER experel  SPECIMEN:  No Specimen  DISPOSITION OF SPECIMEN:  N/A  COUNTS:  YES  TOURNIQUET:   Total Tourniquet Time Documented: Thigh (Left) - 76 minutes Total: Thigh (Left) - 76 minutes   DICTATION: .Other Dictation: Dictation Number [id number]  PLAN OF CARE: Admit to inpatient   PATIENT DISPOSITION:  PACU - hemodynamically stable.   Delay start of Pharmacological VTE agent (>24hrs) due to surgical blood loss or risk of bleeding: no

## 2018-01-30 NOTE — Anesthesia Postprocedure Evaluation (Signed)
Anesthesia Post Note  Patient: CARIZMA DUNSWORTH  Procedure(s) Performed: LEFT TOTAL KNEE ARTHROPLASTY (Left Knee)     Patient location during evaluation: PACU Anesthesia Type: Spinal Level of consciousness: oriented and awake and alert Pain management: pain level controlled Vital Signs Assessment: post-procedure vital signs reviewed and stable Respiratory status: spontaneous breathing, respiratory function stable and patient connected to nasal cannula oxygen Cardiovascular status: blood pressure returned to baseline and stable Postop Assessment: no headache, no backache and no apparent nausea or vomiting Anesthetic complications: no    Last Vitals:  Vitals:   01/30/18 1400 01/30/18 1416  BP: 125/67 113/88  Pulse: (!) 55 (!) 55  Resp: 10   Temp: 36.6 C 36.5 C  SpO2: 100% 100%    Last Pain:  Vitals:   01/30/18 1420  TempSrc:   PainSc: 0-No pain                 Trevor Iha

## 2018-01-30 NOTE — Transfer of Care (Signed)
Immediate Anesthesia Transfer of Care Note  Patient: Kathleen Obrien  Procedure(s) Performed: Procedure(s): LEFT TOTAL KNEE ARTHROPLASTY (Left)  Patient Location: PACU  Anesthesia Type:Spinal  Level of Consciousness:  sedated, patient cooperative and responds to stimulation  Airway & Oxygen Therapy:Patient Spontanous Breathing and Patient connected to face mask oxgen  Post-op Assessment:  Report given to PACU RN and Post -op Vital signs reviewed and stable  Post vital signs:  Reviewed and stable  Last Vitals:  Vitals:   01/30/18 0950 01/30/18 0951  BP:  (!) 128/91  Pulse: (!) 50 (!) 51  Resp: 14 (!) 9  Temp:    SpO2: 100% 100%    Complications: No apparent anesthesia complications

## 2018-01-31 ENCOUNTER — Encounter (HOSPITAL_COMMUNITY): Payer: Self-pay | Admitting: Orthopedic Surgery

## 2018-01-31 LAB — CBC
HEMATOCRIT: 33.8 % — AB (ref 36.0–46.0)
HEMOGLOBIN: 10.7 g/dL — AB (ref 12.0–15.0)
MCH: 30 pg (ref 26.0–34.0)
MCHC: 31.7 g/dL (ref 30.0–36.0)
MCV: 94.7 fL (ref 80.0–100.0)
Platelets: 243 10*3/uL (ref 150–400)
RBC: 3.57 MIL/uL — ABNORMAL LOW (ref 3.87–5.11)
RDW: 12 % (ref 11.5–15.5)
WBC: 8.8 10*3/uL (ref 4.0–10.5)
nRBC: 0 % (ref 0.0–0.2)

## 2018-01-31 LAB — BASIC METABOLIC PANEL
Anion gap: 10 (ref 5–15)
BUN: 14 mg/dL (ref 6–20)
CHLORIDE: 106 mmol/L (ref 98–111)
CO2: 23 mmol/L (ref 22–32)
CREATININE: 0.61 mg/dL (ref 0.44–1.00)
Calcium: 8.4 mg/dL — ABNORMAL LOW (ref 8.9–10.3)
GFR calc Af Amer: >60 mL/min (ref >60)
GFR calc non Af Amer: >60 mL/min (ref >60)
GLUCOSE: 178 mg/dL — AB (ref 70–99)
Potassium: 4 mmol/L (ref 3.5–5.1)
Sodium: 139 mmol/L (ref 135–145)

## 2018-01-31 NOTE — Plan of Care (Signed)
Pt stable overall with no needs. No changes to note. No s/s of distress. Medicating pt for pain as needed. Will continue to monitor.

## 2018-01-31 NOTE — Progress Notes (Signed)
Subjective: 1 Day Post-Op Procedure(s) (LRB): LEFT TOTAL KNEE ARTHROPLASTY (Left) Patient reports pain as mild.    Objective: Vital signs in last 24 hours: Temp:  [96 F (35.6 C)-98.3 F (36.8 C)] 98.3 F (36.8 C) (10/12 0902) Pulse Rate:  [32-68] 56 (10/12 0902) Resp:  [9-29] 16 (10/12 0902) BP: (113-154)/(66-116) 133/83 (10/12 0902) SpO2:  [89 %-100 %] 100 % (10/12 0902)  Intake/Output from previous day: 10/11 0701 - 10/12 0700 In: 4157.3 [P.O.:700; I.V.:3157.2; IV Piggyback:300.1] Out: 1550 [Urine:1500; Blood:50] Intake/Output this shift: No intake/output data recorded.  Recent Labs    01/31/18 0355  HGB 10.7*   Recent Labs    01/31/18 0355  WBC 8.8  RBC 3.57*  HCT 33.8*  PLT 243   Recent Labs    01/31/18 0355  NA 139  K 4.0  CL 106  CO2 23  BUN 14  CREATININE 0.61  GLUCOSE 178*  CALCIUM 8.4*   No results for input(s): LABPT, INR in the last 72 hours.  Neurologically intact ABD soft Neurovascular intact Sensation intact distally No cellulitis present Compartment soft  Anticipated LOS equal to or greater than 2 midnights due to - Age 24 and older with one or more of the following:  - Obesity  - Expected need for hospital services (PT, OT, Nursing) required for safe  discharge  - Anticipated need for postoperative skilled nursing care or inpatient rehab  - Active co-morbidities: Obesity with need for additional training and weightbearing and ambulation. OR   - Unanticipated findings during/Post Surgery: Slow post-op progression: GI, pain control, mobility  - Patient is a high risk of re-admission due to: Pain control is an issue along with need for additional mobility.   Assessment/Plan: 1 Day Post-Op Procedure(s) (LRB): LEFT TOTAL KNEE ARTHROPLASTY (Left) Advance diet Up with therapy Discharge home with home health tomorrow once she has gotten pain under better control and is more confident and safe with ambulation.    Harvie Junior 01/31/2018, 9:15 AM

## 2018-01-31 NOTE — Plan of Care (Signed)
  Problem: Clinical Measurements: Goal: Ability to maintain clinical measurements within normal limits will improve Outcome: Progressing Goal: Will remain free from infection Outcome: Progressing Goal: Respiratory complications will improve Outcome: Progressing Goal: Cardiovascular complication will be avoided Outcome: Progressing   Problem: Nutrition: Goal: Adequate nutrition will be maintained Outcome: Progressing   Problem: Pain Managment: Goal: General experience of comfort will improve Outcome: Progressing   Problem: Safety: Goal: Ability to remain free from injury will improve Outcome: Progressing   Problem: Activity: Goal: Ability to avoid complications of mobility impairment will improve Outcome: Progressing    Reeves Forth, RN 01/31/18 12:17 AM

## 2018-01-31 NOTE — Progress Notes (Signed)
Physical Therapy Treatment Patient Details Name: Kathleen Obrien MRN: [id number] DOB: 21-Mar-1956 Today's Date: 01/31/2018    History of Present Illness 62 yo female s/p L TKR on 01/30/18. PMH includes OA, CAD, HLD, pre-DM, obesity, gastric bypass, R TKR 10/2017.     PT Comments    Pt was seen for second visit today, progressed distance for gait and was able to dc the immobilizer to L knee due to good quality SLR's.  Pt is in minor discomfort and applied fresh ice, nursing to follow with pain meds when appropriate.  Continue on with daily therapy to progress her mobility and will anticipate MD making the decisions about her follow up care level regarding therapies and location.     Follow Up Recommendations  Follow surgeon's recommendation for DC plan and follow-up therapies     Equipment Recommendations  None recommended by PT    Recommendations for Other Services       Precautions / Restrictions Precautions Precautions: Fall Required Braces or Orthoses: Knee Immobilizer - Left Knee Immobilizer - Left: Discontinue once straight leg raise with < 10 degree lag(achieved 10 SLR's today) Restrictions Weight Bearing Restrictions: No Other Position/Activity Restrictions: WBAT     Mobility  Bed Mobility Overal bed mobility: Needs Assistance Bed Mobility: Supine to Sit;Sit to Supine     Supine to sit: Min assist;HOB elevated Sit to supine: Min assist;HOB elevated   General bed mobility comments: oob in recliner  Transfers Overall transfer level: Needs assistance Equipment used: Rolling walker (2 wheeled) Transfers: Sit to/from Stand Sit to Stand: Min guard         General transfer comment: Min guard for safety. Verbal cuing for hand placement.   Ambulation/Gait Ambulation/Gait assistance: Min guard   Assistive device: Rolling walker (2 wheeled) Gait Pattern/deviations: Step-to pattern;Step-through pattern;Decreased stride length Gait velocity: decreased   General  Gait Details: close guard for safety. slow gait speed. VCs safety, sequence. pt tolerated distance well.   Stairs             Wheelchair Mobility    Modified Rankin (Stroke Patients Only)       Balance Overall balance assessment: Needs assistance Sitting-balance support: Feet supported Sitting balance-Leahy Scale: Good     Standing balance support: Bilateral upper extremity supported;During functional activity Standing balance-Leahy Scale: Poor                              Cognition Arousal/Alertness: Awake/alert Behavior During Therapy: WFL for tasks assessed/performed Overall Cognitive Status: Within Functional Limits for tasks assessed                                        Exercises Total Joint Exercises Ankle Circles/Pumps: AROM;Both;5 reps Quad Sets: AROM;Both;10 reps Gluteal Sets: AROM;Both;10 reps Heel Slides: AAROM;Left;5 reps Hip ABduction/ADduction: AAROM;Both;10 reps Straight Leg Raises: AROM;Left;10 reps    General Comments General comments (skin integrity, edema, etc.): pt is scheduled to continue PT today after CPM was removed      Pertinent Vitals/Pain Pain Assessment: 0-10 Pain Score: 4  Pain Location: L knee Pain Descriptors / Indicators: Aching;Sore Pain Intervention(s): Premedicated before session;Repositioned;RN gave pain meds during session;Ice applied    Home Living                      Prior Function  PT Goals (current goals can now be found in the care plan section) Acute Rehab PT Goals Patient Stated Goal: to get stronger and get home Progress towards PT goals: Progressing toward goals    Frequency    7X/week      PT Plan Current plan remains appropriate    Co-evaluation              AM-PAC PT "6 Clicks" Daily Activity  Outcome Measure  Difficulty turning over in bed (including adjusting bedclothes, sheets and blankets)?: A Little Difficulty moving from  lying on back to sitting on the side of the bed? : Unable Difficulty sitting down on and standing up from a chair with arms (e.g., wheelchair, bedside commode, etc,.)?: Unable Help needed moving to and from a bed to chair (including a wheelchair)?: A Little Help needed walking in hospital room?: A Little Help needed climbing 3-5 steps with a railing? : A Little 6 Click Score: 14    End of Session Equipment Utilized During Treatment: Gait belt(immobilizer not needed) Activity Tolerance: Patient tolerated treatment well;Patient limited by fatigue Patient left: in bed;with call bell/phone within reach;with family/visitor present;with nursing/sitter in room Nurse Communication: Mobility status PT Visit Diagnosis: Difficulty in walking, not elsewhere classified (R26.2);Other abnormalities of gait and mobility (R26.89)     Time: 1610-9604 PT Time Calculation (min) (ACUTE ONLY): 28 min  Charges:  $Gait Training: 8-22 mins $Therapeutic Exercise: 8-22 mins                     Ivar Drape 01/31/2018, 6:17 PM   Samul Dada, PT MS Acute Rehab Dept. Number: [facility] [id number] and [facility] [phone]

## 2018-01-31 NOTE — Progress Notes (Signed)
Physical Therapy Treatment Patient Details Name: Kathleen Obrien MRN: [id number] DOB: 1956-02-24 Today's Date: 01/31/2018    History of Present Illness 62 yo female s/p L TKR on 01/30/18. PMH includes OA, CAD, HLD, pre-DM, obesity, gastric bypass, R TKR 10/2017.     PT Comments    Progressing with mobility. Plan is for d/c home tomorrow with HHPT f/u.    Follow Up Recommendations  Follow surgeon's recommendation for DC plan and follow-up therapies     Equipment Recommendations  None recommended by PT    Recommendations for Other Services       Precautions / Restrictions Precautions Precautions: Fall Required Braces or Orthoses: Knee Immobilizer - Left Knee Immobilizer - Left: Discontinue once straight leg raise with < 10 degree lag Restrictions Weight Bearing Restrictions: No Other Position/Activity Restrictions: WBAT     Mobility  Bed Mobility               General bed mobility comments: oob in recliner  Transfers Overall transfer level: Needs assistance Equipment used: Rolling walker (2 wheeled) Transfers: Sit to/from Stand Sit to Stand: Min guard         General transfer comment: Min guard for safety. Verbal cuing for hand placement.   Ambulation/Gait Ambulation/Gait assistance: Min guard Gait Distance (Feet): 50 Feet Assistive device: Rolling walker (2 wheeled) Gait Pattern/deviations: Step-to pattern;Step-through pattern;Decreased stride length     General Gait Details: close guard for safety. slow gait speed. VCs safety, sequence. pt tolerated distance well.   Stairs             Wheelchair Mobility    Modified Rankin (Stroke Patients Only)       Balance Overall balance assessment: Mild deficits observed, not formally tested                                          Cognition Arousal/Alertness: Awake/alert Behavior During Therapy: WFL for tasks assessed/performed Overall Cognitive Status: Within Functional  Limits for tasks assessed                                        Exercises Total Joint Exercises Ankle Circles/Pumps: AROM;Both;10 reps;Seated Quad Sets: AROM;Both;10 reps;Seated Heel Slides: AAROM;Left;10 reps;Seated Hip ABduction/ADduction: Left;AROM;10 reps;Seated Straight Leg Raises: AROM;Left;10 reps;Seated Goniometric ROM: ~10-60 degrees    General Comments        Pertinent Vitals/Pain Pain Assessment: 0-10 Pain Score: 5  Pain Location: L knee Pain Descriptors / Indicators: Aching;Sore Pain Intervention(s): Monitored during session;Repositioned;Ice applied    Home Living                      Prior Function            PT Goals (current goals can now be found in the care plan section) Progress towards PT goals: Progressing toward goals    Frequency    7X/week      PT Plan Current plan remains appropriate    Co-evaluation              AM-PAC PT "6 Clicks" Daily Activity  Outcome Measure  Difficulty turning over in bed (including adjusting bedclothes, sheets and blankets)?: A Little Difficulty moving from lying on back to sitting on the side of the bed? : A Little Difficulty  sitting down on and standing up from a chair with arms (e.g., wheelchair, bedside commode, etc,.)?: A Little Help needed moving to and from a bed to chair (including a wheelchair)?: A Little Help needed walking in hospital room?: A Little Help needed climbing 3-5 steps with a railing? : A Little 6 Click Score: 18    End of Session Equipment Utilized During Treatment: Left knee immobilizer Activity Tolerance: Patient tolerated treatment well Patient left: in chair;with call bell/phone within reach;with chair alarm set   PT Visit Diagnosis: Difficulty in walking, not elsewhere classified (R26.2);Other abnormalities of gait and mobility (R26.89)     Time: 8413-2440 PT Time Calculation (min) (ACUTE ONLY): 33 min  Charges:  $Gait Training: 8-22  mins $Therapeutic Exercise: 8-22 mins                        Rebeca Alert, PT Acute Rehabilitation Services Pager: [phone] Office: [phone]

## 2018-02-01 LAB — CBC
HEMATOCRIT: 31.3 % — AB (ref 36.0–46.0)
HEMOGLOBIN: 9.9 g/dL — AB (ref 12.0–15.0)
MCH: 30.4 pg (ref 26.0–34.0)
MCHC: 31.6 g/dL (ref 30.0–36.0)
MCV: 96 fL (ref 80.0–100.0)
NRBC: 0 % (ref 0.0–0.2)
Platelets: 216 10*3/uL (ref 150–400)
RBC: 3.26 MIL/uL — ABNORMAL LOW (ref 3.87–5.11)
RDW: 12.3 % (ref 11.5–15.5)
WBC: 9 10*3/uL (ref 4.0–10.5)

## 2018-02-01 NOTE — Progress Notes (Signed)
Physical Therapy Treatment Patient Details Name: Kathleen Obrien MRN: [id number] DOB: 1956/04/04 Today's Date: 02/01/2018    History of Present Illness  L TKA    PT Comments     Patient is progressing weell. Does not requir KI> practiced 2 steps without KI. ready for DC.  Follow Up Recommendations  Follow surgeon's recommendation for DC plan and follow-up therapies     Equipment Recommendations  None recommended by PT    Recommendations for Other Services       Precautions / Restrictions Precautions Precaution Comments: does not need KI    Mobility  Bed Mobility Overal bed mobility: Modified Independent             General bed mobility comments: used belt to self assist  Transfers Overall transfer level: Modified independent                  Ambulation/Gait Ambulation/Gait assistance: Supervision Gait Distance (Feet): 90 Feet Assistive device: Rolling walker (2 wheeled) Gait Pattern/deviations: Step-through pattern         Stairs Stairs: Yes Stairs assistance: Min guard Stair Management: One rail Left;Forwards;With cane Number of Stairs: 2 General stair comments: simulated with cane on right   Wheelchair Mobility    Modified Rankin (Stroke Patients Only)       Balance                                            Cognition Arousal/Alertness: Awake/alert                                            Exercises Total Joint Exercises Ankle Circles/Pumps: AROM;Both;5 reps Quad Sets: AROM;Both;10 reps Short Arc Quad: AROM;Left;10 reps;Supine Heel Slides: AAROM;Left;10 reps Hip ABduction/ADduction: AAROM;Both;10 reps Straight Leg Raises: Left;10 reps;AROM Goniometric ROM: 10-60 lef knee flex    General Comments        Pertinent Vitals/Pain Pain Score: 3  Pain Location: L knee Pain Descriptors / Indicators: Sore Pain Intervention(s): Monitored during session    Home Living                      Prior Function            PT Goals (current goals can now be found in the care plan section) Progress towards PT goals: Progressing toward goals    Frequency    7X/week      PT Plan Current plan remains appropriate    Co-evaluation              AM-PAC PT "6 Clicks" Daily Activity  Outcome Measure  Difficulty turning over in bed (including adjusting bedclothes, sheets and blankets)?: None Difficulty moving from lying on back to sitting on the side of the bed? : None Difficulty sitting down on and standing up from a chair with arms (e.g., wheelchair, bedside commode, etc,.)?: A Little Help needed moving to and from a bed to chair (including a wheelchair)?: A Little Help needed walking in hospital room?: A Little Help needed climbing 3-5 steps with a railing? : A Little 6 Click Score: 20    End of Session   Activity Tolerance: Patient tolerated treatment well Patient left: in bed;with call bell/phone within reach Nurse Communication: Mobility status  PT Visit Diagnosis: Difficulty in walking, not elsewhere classified (R26.2);Other abnormalities of gait and mobility (R26.89)     Time: 1105-1140 PT Time Calculation (min) (ACUTE ONLY): 35 min  Charges:  $Gait Training: 8-22 mins $Therapeutic Exercise: 8-22 mins                     Blanchard Kelch PT Acute Rehabilitation Services Pager [phone] Office [phone]    Rada Hay 02/01/2018, 12:44 PM

## 2018-02-01 NOTE — Progress Notes (Signed)
Pt to d/c home with family. No needs at time of d/c. Pt has all home equipment needed at home. No questions or complications on d/c instructions given. Will continue to monitor.

## 2018-02-01 NOTE — Progress Notes (Signed)
Subjective: 2 Days Post-Op Procedure(s) (LRB): LEFT TOTAL KNEE ARTHROPLASTY (Left) Patient reports pain as 4 on 0-10 scale.   She is doing well this morning.  She worked with physical therapy yesterday and will do so soon this morning.  She feels ready to go home today.  She is been icing her knee.   Objective: Vital signs in last 24 hours: Temp:  [97.7 F (36.5 C)-98.7 F (37.1 C)] 97.7 F (36.5 C) (10/13 0520) Pulse Rate:  [54-66] 66 (10/13 0905) Resp:  [15-16] 16 (10/13 0520) BP: (120-155)/(71-89) 138/77 (10/13 0520) SpO2:  [99 %-100 %] 99 % (10/13 0520)  Intake/Output from previous day: 10/12 0701 - 10/13 0700 In: 2744.1 [P.O.:1500; I.V.:1244.1] Out: 500 [Urine:500] Intake/Output this shift: No intake/output data recorded.  Recent Labs    01/31/18 0355 02/01/18 0339  HGB 10.7* 9.9*   Recent Labs    01/31/18 0355 02/01/18 0339  WBC 8.8 9.0  RBC 3.57* 3.26*  HCT 33.8* 31.3*  PLT 243 216   Recent Labs    01/31/18 0355  NA 139  K 4.0  CL 106  CO2 23  BUN 14  CREATININE 0.61  GLUCOSE 178*  CALCIUM 8.4*   No results for input(s): LABPT, INR in the last 72 hours.  Neurologically intact ABD soft Neurovascular intact Sensation intact distally No cellulitis present Compartment soft  Anticipated LOS equal to or greater than 2 midnights due to - Age 61 and older with one or more of the following:  - Obesity  - Expected need for hospital services (PT, OT, Nursing) required for safe  discharge  - Anticipated need for postoperative skilled nursing care or inpatient rehab  - Active co-morbidities: Obesity with need for additional training and weightbearing and ambulation. OR   - Unanticipated findings during/Post Surgery: Slow post-op progression: GI, pain control, mobility  - Patient is a high risk of re-admission due to: Pain control is an issue along with need for additional mobility.   Assessment/Plan: 2 Days Post-Op Procedure(s) (LRB): LEFT TOTAL  KNEE ARTHROPLASTY (Left) Advance diet Up with therapy Discharge home with home health today.  Her pain is better controlled.  She feels more safe and confident with ambulation.    Terance Hart 02/01/2018, 9:57 AM

## 2018-02-01 NOTE — Plan of Care (Signed)
Pt to d/c home with family. No needs at this time.  

## 2018-02-01 NOTE — Care Management Note (Signed)
Case Management Note  Patient Details  Name: KATRIANNA FRIESENHAHN MRN: [id number] Date of Birth: 01-15-1956  Subjective/Objective:     S/p Left TKA               Action/Plan: NCM spoke to pt and states she has [facility] in the past for [facility] PT. States she has RW and bedside commode at home. Contacted [facility] with new referral.   Expected Discharge Date:  02/01/18               Expected Discharge Plan:  Home w Home Health Services  In-House Referral:  NA  Discharge planning Services  CM Consult  Post Acute Care Choice:  Home Health Choice offered to:  Patient  DME Arranged:  N/A DME Agency:  NA  HH Arranged:  PT HH Agency:  [facility]  Status of Service:  Completed, signed off  If discussed at [employer], dates discussed:    Additional Comments:  Elliot Cousin, RN 02/01/2018, 11:50 AM

## 2018-02-18 NOTE — Discharge Summary (Signed)
Physician Discharge Summary  Patient ID: Kathleen Obrien MRN: [id number] DOB/AGE: 61/28/1957 61 y.o.  Admit date: 01/30/2018 Discharge date: 02/01/18 Admission Diagnoses: Primary osteoarthritis of the left knee Obesity  Discharge Diagnoses:  Principal Problem:   Primary osteoarthritis of left knee   Discharged Condition: good  Hospital Course: [name] was admitted after undergoing left total knee arthroplasty.  She worked with physical therapy and had issues with mobilizing and pain control.  Once her pain was controlled on oral pain medications and she was cleared by physical therapy for discharge home with home health she was deemed suitable for discharge.  During the course of her stay she required IV pain medication as well as managing her other comorbid conditions.  Consults: None  Significant Diagnostic Studies: none  Treatments: therapies: PT  Discharge Exam: Blood pressure 131/78, pulse (!) 56, temperature 98.2 F (36.8 C), temperature source Oral, resp. rate 16, height 5\' 8"  (1.727 m), weight 116.6 kg, SpO2 99 %. General appearance: alert  No acute distress Respirations even unlabored Abdomen nondistended Skin intact Oriented Sclera without icterus Evaluation of the left knee demonstrates Aquaseal dressing intact.  Light shadowing.  No tenderness palpation about the calf.  She is able to dorsiflex plantarflex the ankle.  Sensation intact in the dorsum of her foot.  Foot is warm and well-perfused.  Disposition:    She is stable for discharge home with home health PT.   She will follow-up at her previously scheduled postoperative appointment.   She will maintain the Aquasol dressing.   Allergies as of 02/01/2018      Reactions   Banana Nausea And Vomiting      Medication List    STOP taking these medications   ADVIL PM 200-38 MG Tabs Generic drug:  Ibuprofen-diphenhydrAMINE Cit   diclofenac sodium 1 % Gel Commonly known as:  VOLTAREN   meloxicam 15 MG  tablet Commonly known as:  MOBIC     TAKE these medications   aspirin EC 325 MG tablet Take 1 tablet (325 mg total) by mouth 2 (two) times daily after a meal. Take x 1 month post op to decrease risk of blood clots.   atenolol 50 MG tablet Commonly known as:  TENORMIN Take 50 mg by mouth 2 (two) times daily.   BARIATRIC MULTIVITAMINS/IRON PO Take 1 capsule by mouth daily.   calcium-vitamin D 250-100 MG-UNIT tablet Take 1 tablet by mouth daily.   docusate sodium 100 MG capsule Commonly known as:  COLACE Take 1 capsule (100 mg total) by mouth 2 (two) times daily.   DULoxetine 30 MG capsule Commonly known as:  CYMBALTA Take 30 mg by mouth 2 (two) times daily.   NITROSTAT 0.4 MG SL tablet Generic drug:  nitroGLYCERIN Place 0.4 mg under the tongue every 5 (five) minutes as needed for chest pain.   omeprazole 20 MG capsule Commonly known as:  PRILOSEC Take 20 mg by mouth 2 (two) times daily.   oxyCODONE-acetaminophen 5-325 MG tablet Commonly known as:  PERCOCET/ROXICET Take 1-2 tablets by mouth every 6 (six) hours as needed for severe pain. What changed:  when to take this   pravastatin 10 MG tablet Commonly known as:  PRAVACHOL Take 10 mg by mouth at bedtime.   tiZANidine 4 MG tablet Commonly known as:  ZANAFLEX Take 1 tablet (4 mg total) by mouth every 8 (eight) hours as needed for muscle spasms. What changed:  when to take this      Follow-up Information  Jodi Geralds, MD. Schedule an appointment as soon as possible for a visit in 2 weeks.   Specialty:  Orthopedic Surgery Contact information: [address] [place] [address] [phone]        [facility].   Specialty:  Home Health Services Why:  Home Health Physical Therapy-agency will call initial visit Contact information: [address] [place] [address] [phone]           Signed: Terance Hart 02/18/2018, 11:19 AM

## 2018-11-26 ENCOUNTER — Other Ambulatory Visit: Payer: Self-pay | Admitting: Internal Medicine

## 2018-11-26 DIAGNOSIS — Z1231 Encounter for screening mammogram for malignant neoplasm of breast: Secondary | ICD-10-CM

## 2018-12-01 ENCOUNTER — Ambulatory Visit
Admission: RE | Admit: 2018-12-01 | Discharge: 2018-12-01 | Disposition: A | Discharge status: Home / Self Care | Payer: Medicare Other | Source: Ambulatory Visit | Attending: Internal Medicine | Admitting: Internal Medicine

## 2018-12-01 ENCOUNTER — Other Ambulatory Visit: Payer: Self-pay

## 2018-12-01 DIAGNOSIS — Z1231 Encounter for screening mammogram for malignant neoplasm of breast: Secondary | ICD-10-CM

## 2019-04-09 ENCOUNTER — Other Ambulatory Visit: Payer: Self-pay | Admitting: Orthopedic Surgery

## 2019-04-09 DIAGNOSIS — M19012 Primary osteoarthritis, left shoulder: Secondary | ICD-10-CM

## 2019-04-29 ENCOUNTER — Ambulatory Visit
Admission: RE | Admit: 2019-04-29 | Discharge: 2019-04-29 | Disposition: A | Discharge status: Home / Self Care | Payer: Medicare Other | Source: Ambulatory Visit | Attending: Orthopedic Surgery | Admitting: Orthopedic Surgery

## 2019-04-29 DIAGNOSIS — M19012 Primary osteoarthritis, left shoulder: Secondary | ICD-10-CM

## 2019-05-06 ENCOUNTER — Other Ambulatory Visit: Payer: Self-pay | Admitting: Orthopedic Surgery

## 2019-05-10 ENCOUNTER — Other Ambulatory Visit: Payer: Self-pay

## 2019-05-10 ENCOUNTER — Encounter (HOSPITAL_BASED_OUTPATIENT_CLINIC_OR_DEPARTMENT_OTHER): Payer: Self-pay | Admitting: Orthopedic Surgery

## 2019-05-11 ENCOUNTER — Other Ambulatory Visit: Payer: Self-pay | Admitting: Orthopedic Surgery

## 2019-05-17 ENCOUNTER — Other Ambulatory Visit (HOSPITAL_COMMUNITY)
Admission: RE | Admit: 2019-05-17 | Discharge: 2019-05-17 | Disposition: A | Discharge status: Home / Self Care | Payer: Medicare Other | Source: Ambulatory Visit | Attending: Orthopedic Surgery | Admitting: Orthopedic Surgery

## 2019-05-17 DIAGNOSIS — Z01812 Encounter for preprocedural laboratory examination: Secondary | ICD-10-CM | POA: Diagnosis present

## 2019-05-17 DIAGNOSIS — Z20822 Contact with and (suspected) exposure to covid-19: Secondary | ICD-10-CM | POA: Insufficient documentation

## 2019-05-17 LAB — SARS CORONAVIRUS 2 (TAT 6-24 HRS): SARS Coronavirus 2: NEGATIVE

## 2019-05-18 ENCOUNTER — Ambulatory Visit
Admission: RE | Admit: 2019-05-18 | Discharge: 2019-05-18 | Disposition: A | Discharge status: Home / Self Care | Payer: Medicare Other | Source: Ambulatory Visit | Attending: Orthopedic Surgery | Admitting: Orthopedic Surgery

## 2019-05-18 ENCOUNTER — Other Ambulatory Visit: Payer: Self-pay | Admitting: Orthopedic Surgery

## 2019-05-18 ENCOUNTER — Other Ambulatory Visit: Payer: Self-pay

## 2019-05-18 ENCOUNTER — Encounter (HOSPITAL_BASED_OUTPATIENT_CLINIC_OR_DEPARTMENT_OTHER)
Admission: RE | Admit: 2019-05-18 | Discharge: 2019-05-18 | Disposition: A | Discharge status: Home / Self Care | Payer: Medicare Other | Source: Ambulatory Visit | Attending: Orthopedic Surgery | Admitting: Orthopedic Surgery

## 2019-05-18 DIAGNOSIS — Z9884 Bariatric surgery status: Secondary | ICD-10-CM | POA: Diagnosis not present

## 2019-05-18 DIAGNOSIS — Z01811 Encounter for preprocedural respiratory examination: Secondary | ICD-10-CM

## 2019-05-18 DIAGNOSIS — Z96653 Presence of artificial knee joint, bilateral: Secondary | ICD-10-CM | POA: Diagnosis not present

## 2019-05-18 DIAGNOSIS — R7303 Prediabetes: Secondary | ICD-10-CM | POA: Diagnosis not present

## 2019-05-18 DIAGNOSIS — M19012 Primary osteoarthritis, left shoulder: Secondary | ICD-10-CM | POA: Diagnosis not present

## 2019-05-18 DIAGNOSIS — Z6841 Body Mass Index (BMI) 40.0 and over, adult: Secondary | ICD-10-CM | POA: Diagnosis not present

## 2019-05-18 DIAGNOSIS — Z91018 Allergy to other foods: Secondary | ICD-10-CM | POA: Diagnosis not present

## 2019-05-18 DIAGNOSIS — Z01812 Encounter for preprocedural laboratory examination: Secondary | ICD-10-CM | POA: Diagnosis not present

## 2019-05-18 DIAGNOSIS — E785 Hyperlipidemia, unspecified: Secondary | ICD-10-CM | POA: Diagnosis not present

## 2019-05-18 DIAGNOSIS — Z79899 Other long term (current) drug therapy: Secondary | ICD-10-CM | POA: Diagnosis not present

## 2019-05-18 DIAGNOSIS — I10 Essential (primary) hypertension: Secondary | ICD-10-CM | POA: Diagnosis not present

## 2019-05-18 DIAGNOSIS — I2510 Atherosclerotic heart disease of native coronary artery without angina pectoris: Secondary | ICD-10-CM | POA: Diagnosis not present

## 2019-05-18 DIAGNOSIS — Z8261 Family history of arthritis: Secondary | ICD-10-CM | POA: Diagnosis not present

## 2019-05-18 DIAGNOSIS — Z791 Long term (current) use of non-steroidal anti-inflammatories (NSAID): Secondary | ICD-10-CM | POA: Diagnosis not present

## 2019-05-18 DIAGNOSIS — Z90710 Acquired absence of both cervix and uterus: Secondary | ICD-10-CM | POA: Diagnosis not present

## 2019-05-18 LAB — COMPREHENSIVE METABOLIC PANEL
ALT: 12 U/L (ref 0–44)
AST: 18 U/L (ref 15–41)
Albumin: 3.9 g/dL (ref 3.5–5.0)
Alkaline Phosphatase: 54 U/L (ref 38–126)
Anion gap: 11 (ref 5–15)
BUN: 11 mg/dL (ref 8–23)
CO2: 28 mmol/L (ref 22–32)
Calcium: 8.9 mg/dL (ref 8.9–10.3)
Chloride: 106 mmol/L (ref 98–111)
Creatinine, Ser: 0.77 mg/dL (ref 0.44–1.00)
GFR calc Af Amer: >60 mL/min (ref >60)
GFR calc non Af Amer: >60 mL/min (ref >60)
Glucose, Bld: 91 mg/dL (ref 70–99)
Potassium: 4 mmol/L (ref 3.5–5.1)
Sodium: 145 mmol/L (ref 135–145)
Total Bilirubin: 0.9 mg/dL (ref 0.3–1.2)
Total Protein: 6.6 g/dL (ref 6.5–8.1)

## 2019-05-18 LAB — CBC WITH DIFFERENTIAL/PLATELET
Abs Immature Granulocytes: 0 10*3/uL (ref 0.00–0.07)
Basophils Absolute: 0.1 10*3/uL (ref 0.0–0.1)
Basophils Relative: 3 %
Eosinophils Absolute: 0 10*3/uL (ref 0.0–0.5)
Eosinophils Relative: 1 %
HCT: 38 % (ref 36.0–46.0)
Hemoglobin: 12.6 g/dL (ref 12.0–15.0)
Lymphocytes Relative: 58 %
Lymphs Abs: 1.6 10*3/uL (ref 0.7–4.0)
MCH: 31.5 pg (ref 26.0–34.0)
MCHC: 33.2 g/dL (ref 30.0–36.0)
MCV: 95 fL (ref 80.0–100.0)
Monocytes Absolute: 0.1 10*3/uL (ref 0.1–1.0)
Monocytes Relative: 3 %
Neutro Abs: 0.9 10*3/uL — ABNORMAL LOW (ref 1.7–7.7)
Neutrophils Relative %: 35 %
Platelets: 208 10*3/uL (ref 150–400)
RBC: 4 MIL/uL (ref 3.87–5.11)
RDW: 12 % (ref 11.5–15.5)
WBC: 2.7 10*3/uL — ABNORMAL LOW (ref 4.0–10.5)
nRBC: 0 % (ref 0.0–0.2)
nRBC: 0 /100 WBC

## 2019-05-18 LAB — SURGICAL PCR SCREEN
MRSA, PCR: NEGATIVE
Staphylococcus aureus: NEGATIVE

## 2019-05-18 LAB — TYPE AND SCREEN
ABO/RH(D): O POS
Antibody Screen: NEGATIVE

## 2019-05-18 LAB — URINALYSIS, ROUTINE W REFLEX MICROSCOPIC
Bilirubin Urine: NEGATIVE
Glucose, UA: NEGATIVE mg/dL
Hgb urine dipstick: NEGATIVE
Ketones, ur: NEGATIVE mg/dL
Leukocytes,Ua: NEGATIVE
Nitrite: NEGATIVE
Protein, ur: 30 mg/dL — AB
Specific Gravity, Urine: 1.031 — ABNORMAL HIGH (ref 1.005–1.030)
pH: 5 (ref 5.0–8.0)

## 2019-05-18 LAB — APTT: aPTT: 32 seconds (ref 24–36)

## 2019-05-18 LAB — ABO/RH: ABO/RH(D): O POS

## 2019-05-18 LAB — PROTIME-INR
INR: 1 (ref 0.8–1.2)
Prothrombin Time: 13.1 seconds (ref 11.4–15.2)

## 2019-05-18 NOTE — Progress Notes (Signed)
      Enhanced Recovery after Surgery for Orthopedics Enhanced Recovery after Surgery is a protocol used to improve the stress on your body and your recovery after surgery.  Patient Instructions  . The night before surgery:  o No food after midnight. ONLY clear liquids after midnight  . The day of surgery (if you do NOT have diabetes):  o Drink ONE (1) Pre-Surgery Clear Ensure as directed.   o This drink was given to you during your hospital  pre-op appointment visit. o The pre-op nurse will instruct you on the time to drink the  Pre-Surgery Ensure depending on your surgery time. o Finish the drink at the designated time by the pre-op nurse.  o Nothing else to drink after completing the  Pre-Surgery Clear Ensure.  . The day of surgery (if you have diabetes): o Drink ONE (1) Gatorade 2 (G2) as directed. o This drink was given to you during your hospital  pre-op appointment visit.  o The pre-op nurse will instruct you on the time to drink the   Gatorade 2 (G2) depending on your surgery time. o Color of the Gatorade may vary. Red is not allowed. o Nothing else to drink after completing the  Gatorade 2 (G2).         If you have questions, please contact your surgeon's office. Surgical soap given with instructions, pt verbalized understanding. Benzoyl peroxide gel given with instructions, pt verbalized understanding. 

## 2019-05-18 NOTE — Progress Notes (Signed)
Gave instructions for pt to go obtain Chest xray per [facility], fax sent.

## 2019-05-20 ENCOUNTER — Encounter (HOSPITAL_BASED_OUTPATIENT_CLINIC_OR_DEPARTMENT_OTHER)
Admission: RE | Disposition: A | Discharge status: Home / Self Care | Payer: Self-pay | Source: Home / Self Care | Attending: Orthopedic Surgery

## 2019-05-20 ENCOUNTER — Other Ambulatory Visit: Payer: Self-pay

## 2019-05-20 ENCOUNTER — Encounter (HOSPITAL_BASED_OUTPATIENT_CLINIC_OR_DEPARTMENT_OTHER): Payer: Self-pay | Admitting: Orthopedic Surgery

## 2019-05-20 ENCOUNTER — Ambulatory Visit (HOSPITAL_BASED_OUTPATIENT_CLINIC_OR_DEPARTMENT_OTHER)
Admission: RE | Admit: 2019-05-20 | Discharge: 2019-05-20 | Disposition: A | Discharge status: Home / Self Care | Payer: Medicare Other | Attending: Orthopedic Surgery | Admitting: Orthopedic Surgery

## 2019-05-20 ENCOUNTER — Ambulatory Visit (HOSPITAL_BASED_OUTPATIENT_CLINIC_OR_DEPARTMENT_OTHER): Payer: Medicare Other | Admitting: Certified Registered"

## 2019-05-20 DIAGNOSIS — R7303 Prediabetes: Secondary | ICD-10-CM | POA: Insufficient documentation

## 2019-05-20 DIAGNOSIS — I10 Essential (primary) hypertension: Secondary | ICD-10-CM | POA: Diagnosis not present

## 2019-05-20 DIAGNOSIS — E785 Hyperlipidemia, unspecified: Secondary | ICD-10-CM | POA: Insufficient documentation

## 2019-05-20 DIAGNOSIS — Z90710 Acquired absence of both cervix and uterus: Secondary | ICD-10-CM | POA: Insufficient documentation

## 2019-05-20 DIAGNOSIS — Z01811 Encounter for preprocedural respiratory examination: Secondary | ICD-10-CM

## 2019-05-20 DIAGNOSIS — Z96653 Presence of artificial knee joint, bilateral: Secondary | ICD-10-CM | POA: Insufficient documentation

## 2019-05-20 DIAGNOSIS — Z6841 Body Mass Index (BMI) 40.0 and over, adult: Secondary | ICD-10-CM | POA: Insufficient documentation

## 2019-05-20 DIAGNOSIS — M19012 Primary osteoarthritis, left shoulder: Secondary | ICD-10-CM | POA: Insufficient documentation

## 2019-05-20 DIAGNOSIS — Z791 Long term (current) use of non-steroidal anti-inflammatories (NSAID): Secondary | ICD-10-CM | POA: Insufficient documentation

## 2019-05-20 DIAGNOSIS — I2510 Atherosclerotic heart disease of native coronary artery without angina pectoris: Secondary | ICD-10-CM | POA: Insufficient documentation

## 2019-05-20 DIAGNOSIS — Z79899 Other long term (current) drug therapy: Secondary | ICD-10-CM | POA: Insufficient documentation

## 2019-05-20 DIAGNOSIS — Z9884 Bariatric surgery status: Secondary | ICD-10-CM | POA: Insufficient documentation

## 2019-05-20 DIAGNOSIS — Z91018 Allergy to other foods: Secondary | ICD-10-CM | POA: Insufficient documentation

## 2019-05-20 DIAGNOSIS — Z8261 Family history of arthritis: Secondary | ICD-10-CM | POA: Insufficient documentation

## 2019-05-20 HISTORY — PX: TOTAL SHOULDER ARTHROPLASTY: SHX126

## 2019-05-20 HISTORY — DX: Primary generalized (osteo)arthritis: M15.0

## 2019-05-20 LAB — PATHOLOGIST SMEAR REVIEW

## 2019-05-20 SURGERY — ARTHROPLASTY, SHOULDER, TOTAL
Anesthesia: General | Site: Shoulder | Laterality: Left

## 2019-05-20 MED ORDER — MIDAZOLAM HCL 2 MG/2ML IJ SOLN
1.0000 mg | INTRAMUSCULAR | Status: DC | PRN
  Administered 2019-05-20: 2 mg via INTRAVENOUS

## 2019-05-20 MED ORDER — ROCURONIUM BROMIDE 100 MG/10ML IV SOLN
INTRAVENOUS | Status: DC | PRN
  Administered 2019-05-20: 50 mg via INTRAVENOUS
  Administered 2019-05-20 (×3): 10 mg via INTRAVENOUS

## 2019-05-20 MED ORDER — BUPIVACAINE LIPOSOME 1.3 % IJ SUSP
INTRAMUSCULAR | Status: DC | PRN
  Administered 2019-05-20: 10 mg

## 2019-05-20 MED ORDER — OXYCODONE HCL 5 MG/5ML PO SOLN: 5.0000 mg | Freq: Once | ORAL | Status: DC | PRN

## 2019-05-20 MED ORDER — ESMOLOL HCL 100 MG/10ML IV SOLN
INTRAVENOUS | Status: AC
  Filled 2019-05-20: qty 20

## 2019-05-20 MED ORDER — BUPIVACAINE HCL (PF) 0.5 % IJ SOLN
INTRAMUSCULAR | Status: DC | PRN
  Administered 2019-05-20: 15 mL via PERINEURAL

## 2019-05-20 MED ORDER — TIZANIDINE HCL 4 MG PO TABS: 4.0000 mg | ORAL_TABLET | Freq: Three times a day (TID) | ORAL | 1 refills | Status: DC | PRN

## 2019-05-20 MED ORDER — FENTANYL CITRATE (PF) 100 MCG/2ML IJ SOLN
50.0000 ug | INTRAMUSCULAR | Status: DC | PRN
  Administered 2019-05-20: 100 ug via INTRAVENOUS
  Administered 2019-05-20: 50 ug via INTRAVENOUS

## 2019-05-20 MED ORDER — SUGAMMADEX SODIUM 500 MG/5ML IV SOLN
INTRAVENOUS | Status: AC
  Filled 2019-05-20: qty 5

## 2019-05-20 MED ORDER — DEXTROSE 5 % IV SOLN: 3.0000 g | Freq: Three times a day (TID) | INTRAVENOUS | Status: DC

## 2019-05-20 MED ORDER — ONDANSETRON HCL 4 MG/2ML IJ SOLN
INTRAMUSCULAR | Status: DC | PRN
  Administered 2019-05-20: 4 mg via INTRAVENOUS

## 2019-05-20 MED ORDER — OXYCODONE-ACETAMINOPHEN 5-325 MG PO TABS: ORAL_TABLET | ORAL | 0 refills | Status: DC

## 2019-05-20 MED ORDER — FENTANYL CITRATE (PF) 100 MCG/2ML IJ SOLN
INTRAMUSCULAR | Status: AC
  Filled 2019-05-20: qty 2

## 2019-05-20 MED ORDER — DEXAMETHASONE SODIUM PHOSPHATE 4 MG/ML IJ SOLN
INTRAMUSCULAR | Status: DC | PRN
  Administered 2019-05-20: 5 mg via INTRAVENOUS

## 2019-05-20 MED ORDER — CEFAZOLIN SODIUM-DEXTROSE 1-4 GM/50ML-% IV SOLN
INTRAVENOUS | Status: AC
  Filled 2019-05-20: qty 50

## 2019-05-20 MED ORDER — DEXAMETHASONE SODIUM PHOSPHATE 10 MG/ML IJ SOLN
INTRAMUSCULAR | Status: AC
  Filled 2019-05-20: qty 1

## 2019-05-20 MED ORDER — SUGAMMADEX SODIUM 500 MG/5ML IV SOLN
INTRAVENOUS | Status: DC | PRN
  Administered 2019-05-20: 300 mg via INTRAVENOUS

## 2019-05-20 MED ORDER — PHENYLEPHRINE HCL-NACL 10-0.9 MG/250ML-% IV SOLN
INTRAVENOUS | Status: DC | PRN
  Administered 2019-05-20: 20 ug/min via INTRAVENOUS

## 2019-05-20 MED ORDER — SODIUM CHLORIDE 0.9 % IR SOLN
Status: DC | PRN
  Administered 2019-05-20: 1000 mL

## 2019-05-20 MED ORDER — LACTATED RINGERS IV SOLN: INTRAVENOUS | Status: DC

## 2019-05-20 MED ORDER — CHLORHEXIDINE GLUCONATE 4 % EX LIQD: 60.0000 mL | Freq: Once | CUTANEOUS | Status: DC

## 2019-05-20 MED ORDER — OXYCODONE HCL 5 MG PO TABS: 5.0000 mg | ORAL_TABLET | Freq: Once | ORAL | Status: DC | PRN

## 2019-05-20 MED ORDER — PHENYLEPHRINE HCL (PRESSORS) 10 MG/ML IV SOLN
INTRAVENOUS | Status: AC
  Filled 2019-05-20: qty 1

## 2019-05-20 MED ORDER — TRANEXAMIC ACID-NACL 1000-0.7 MG/100ML-% IV SOLN
1000.0000 mg | INTRAVENOUS | Status: AC
  Administered 2019-05-20: 1000 mg via INTRAVENOUS

## 2019-05-20 MED ORDER — ONDANSETRON HCL 4 MG/2ML IJ SOLN: 4.0000 mg | Freq: Once | INTRAMUSCULAR | Status: DC | PRN

## 2019-05-20 MED ORDER — FENTANYL CITRATE (PF) 100 MCG/2ML IJ SOLN: 25.0000 ug | INTRAMUSCULAR | Status: DC | PRN

## 2019-05-20 MED ORDER — MIDAZOLAM HCL 2 MG/2ML IJ SOLN
INTRAMUSCULAR | Status: AC
  Filled 2019-05-20: qty 2

## 2019-05-20 MED ORDER — TRANEXAMIC ACID-NACL 1000-0.7 MG/100ML-% IV SOLN
INTRAVENOUS | Status: AC
  Filled 2019-05-20: qty 100

## 2019-05-20 MED ORDER — CEFAZOLIN SODIUM-DEXTROSE 2-4 GM/100ML-% IV SOLN
INTRAVENOUS | Status: AC
  Filled 2019-05-20: qty 100

## 2019-05-20 MED ORDER — ONDANSETRON HCL 4 MG/2ML IJ SOLN
INTRAMUSCULAR | Status: AC
  Filled 2019-05-20: qty 2

## 2019-05-20 MED ORDER — ROCURONIUM BROMIDE 10 MG/ML (PF) SYRINGE
PREFILLED_SYRINGE | INTRAVENOUS | Status: AC
  Filled 2019-05-20: qty 10

## 2019-05-20 MED ORDER — PROPOFOL 10 MG/ML IV BOLUS
INTRAVENOUS | Status: DC | PRN
  Administered 2019-05-20: 200 mg via INTRAVENOUS

## 2019-05-20 MED ORDER — DEXTROSE 5 % IV SOLN
3.0000 g | Freq: Once | INTRAVENOUS | Status: AC
  Administered 2019-05-20: 13:00:00 3 g via INTRAVENOUS

## 2019-05-20 MED ORDER — CEFAZOLIN SODIUM-DEXTROSE 2-4 GM/100ML-% IV SOLN: 2.0000 g | INTRAVENOUS | Status: DC

## 2019-05-20 MED ORDER — PROPOFOL 10 MG/ML IV BOLUS
INTRAVENOUS | Status: AC
  Filled 2019-05-20: qty 20

## 2019-05-20 SURGICAL SUPPLY — 86 items
AID PSTN UNV HD RSTRNT DISP (MISCELLANEOUS) ×1
APL PRP STRL LF DISP 70% ISPRP (MISCELLANEOUS) ×1
BIT DRILL 5/64X5 DISP (BIT) ×3 IMPLANT
BLADE SAW SAG 73X25 THK (BLADE) ×2
BLADE SAW SGTL 73X25 THK (BLADE) ×1 IMPLANT
BLADE SURG 10 STRL SS (BLADE) IMPLANT
CEMENT BONE DEPUY (Cement) ×3 IMPLANT
CHLORAPREP W/TINT 26 (MISCELLANEOUS) ×3 IMPLANT
CLEANER CAUTERY TIP 5X5 PAD (MISCELLANEOUS) IMPLANT
CLOSURE WOUND 1/2 X4 (GAUZE/BANDAGES/DRESSINGS) ×1
COMP GLENOID 50 LRG SHLDR (Miscellaneous) ×3 IMPLANT
COMPONENT GLENOID 50 LRG SHLDR (Miscellaneous) IMPLANT
COOLER ICEMAN CLASSIC (MISCELLANEOUS) ×3 IMPLANT
COVER BACK TABLE 60X90IN (DRAPES) ×1 IMPLANT
COVER MAYO STAND STRL (DRAPES) ×3 IMPLANT
COVER WAND RF STERILE (DRAPES) IMPLANT
DRAPE IMP U-DRAPE 54X76 (DRAPES) ×3 IMPLANT
DRAPE INCISE IOBAN 66X45 STRL (DRAPES) ×3 IMPLANT
DRAPE SURG 17X23 STRL (DRAPES) ×3 IMPLANT
DRAPE U-SHAPE 47X51 STRL (DRAPES) ×3 IMPLANT
DRAPE U-SHAPE 76X120 STRL (DRAPES) ×6 IMPLANT
DRSG AQUACEL AG ADV 3.5X 6 (GAUZE/BANDAGES/DRESSINGS) ×2 IMPLANT
ELECT BLADE 4.0 EZ CLEAN MEGAD (MISCELLANEOUS) ×3
ELECT REM PT RETURN 9FT ADLT (ELECTROSURGICAL) ×3
ELECTRODE BLDE 4.0 EZ CLN MEGD (MISCELLANEOUS) ×1 IMPLANT
ELECTRODE REM PT RTRN 9FT ADLT (ELECTROSURGICAL) ×1 IMPLANT
GAUZE SPONGE 4X4 12PLY STRL (GAUZE/BANDAGES/DRESSINGS) ×3 IMPLANT
GLOVE BIO SURGEON STRL SZ7 (GLOVE) ×3 IMPLANT
GLOVE BIO SURGEON STRL SZ7.5 (GLOVE) ×3 IMPLANT
GLOVE BIOGEL PI IND STRL 7.0 (GLOVE) ×1 IMPLANT
GLOVE BIOGEL PI IND STRL 8 (GLOVE) ×1 IMPLANT
GLOVE BIOGEL PI INDICATOR 7.0 (GLOVE) ×2
GLOVE BIOGEL PI INDICATOR 8 (GLOVE) ×2
GOWN STRL REUS W/ TWL LRG LVL3 (GOWN DISPOSABLE) ×2 IMPLANT
GOWN STRL REUS W/ TWL XL LVL3 (GOWN DISPOSABLE) ×1 IMPLANT
GOWN STRL REUS W/TWL LRG LVL3 (GOWN DISPOSABLE) ×6
GOWN STRL REUS W/TWL XL LVL3 (GOWN DISPOSABLE) ×3
GUIDEWIRE GLENOID 2.5X220 (WIRE) ×2 IMPLANT
HANDPIECE INTERPULSE COAX TIP (DISPOSABLE) ×3
HEAD HUMERAL AEQUALIS 52 SHLDR (Miscellaneous) ×2 IMPLANT
HEMOSTAT SURGICEL 2X14 (HEMOSTASIS) ×3 IMPLANT
HOOD PEEL AWAY FLYTE STAYCOOL (MISCELLANEOUS) ×6 IMPLANT
KIT TURNOVER KIT B (KITS) ×3 IMPLANT
MANIFOLD NEPTUNE II (INSTRUMENTS) ×3 IMPLANT
NDL MAYO TROCAR (NEEDLE) IMPLANT
NDL TROCAR POINT SZ 2 1/2 (NEEDLE) ×1 IMPLANT
NEEDLE MAYO TROCAR (NEEDLE) IMPLANT
NEEDLE TROCAR POINT SZ 2 1/2 (NEEDLE) ×3 IMPLANT
NS IRRIG 1000ML POUR BTL (IV SOLUTION) ×3 IMPLANT
PACK BASIN DAY SURGERY FS (CUSTOM PROCEDURE TRAY) ×3 IMPLANT
PACK SHOULDER (CUSTOM PROCEDURE TRAY) ×3 IMPLANT
PAD ARMBOARD 7.5X6 YLW CONV (MISCELLANEOUS) ×6 IMPLANT
PAD CLEANER CAUTERY TIP 5X5 (MISCELLANEOUS)
PAD COLD SHLDR WRAP-ON (PAD) ×3 IMPLANT
PLATE LCP 4H 36MM 2.4MM (Plate) ×2 IMPLANT
RESTRAINT HEAD UNIVERSAL NS (MISCELLANEOUS) ×3 IMPLANT
RETRIEVER SUT HEWSON (MISCELLANEOUS) ×3 IMPLANT
SET HNDPC FAN SPRY TIP SCT (DISPOSABLE) ×1 IMPLANT
SLEEVE SCD COMPRESS KNEE MED (MISCELLANEOUS) ×3 IMPLANT
SLING ARM FOAM STRAP LRG (SOFTGOODS) IMPLANT
SLING ARM FOAM STRAP MED (SOFTGOODS) IMPLANT
SLING ARM FOAM STRAP XLG (SOFTGOODS) ×2 IMPLANT
SMARTMIX MINI TOWER (MISCELLANEOUS) ×3
SPONGE LAP 18X18 RF (DISPOSABLE) ×5 IMPLANT
SPONGE LAP 4X18 RFD (DISPOSABLE) ×2 IMPLANT
STEM HUMERAL SZ2B STND 70 PTC (Stem) ×3 IMPLANT
STEM HUMERAL SZ2BSTD 70 PTC (Stem) IMPLANT
STRIP CLOSURE SKIN 1/2X4 (GAUZE/BANDAGES/DRESSINGS) ×2 IMPLANT
SUCTION FRAZIER HANDLE 10FR (MISCELLANEOUS) ×2
SUCTION TUBE FRAZIER 10FR DISP (MISCELLANEOUS) ×1 IMPLANT
SUPPORT WRAP ARM LG (MISCELLANEOUS) ×3 IMPLANT
SUT ETHIBOND 2 OS 4 DA (SUTURE) IMPLANT
SUT ETHIBOND NAB CT1 #1 30IN (SUTURE) ×9 IMPLANT
SUT FIBERWIRE #2 38 T-5 BLUE (SUTURE)
SUT MNCRL AB 4-0 PS2 18 (SUTURE) ×3 IMPLANT
SUT VIC AB 0 CT1 27 (SUTURE) ×3
SUT VIC AB 0 CT1 27XBRD ANBCTR (SUTURE) ×1 IMPLANT
SUT VIC AB 2-0 CT1 27 (SUTURE) ×3
SUT VIC AB 2-0 CT1 TAPERPNT 27 (SUTURE) ×1 IMPLANT
SUTURE FIBERWR #2 38 T-5 BLUE (SUTURE) IMPLANT
SYR BULB 3OZ (MISCELLANEOUS) ×3 IMPLANT
TAPE LABRALWHITE 1.5X36 (TAPE) ×3 IMPLANT
TAPE SUT LABRALTAP WHT/BLK (SUTURE) ×3 IMPLANT
TOWEL GREEN STERILE FF (TOWEL DISPOSABLE) ×6 IMPLANT
TOWER SMARTMIX MINI (MISCELLANEOUS) ×1 IMPLANT
YANKAUER SUCT BULB TIP NO VENT (SUCTIONS) ×3 IMPLANT

## 2019-05-20 NOTE — Transfer of Care (Signed)
Immediate Anesthesia Transfer of Care Note  Patient: Kathleen Obrien  Procedure(s) Performed: TOTAL SHOULDER ARTHROPLASTY (Left Shoulder)  Patient Location: PACU  Anesthesia Type:General and Regional  Level of Consciousness: drowsy and patient cooperative  Airway & Oxygen Therapy: Patient Spontanous Breathing and Patient connected to face mask oxygen  Post-op Assessment: Report given to RN and Post -op Vital signs reviewed and stable  Post vital signs: Reviewed and stable  Last Vitals:  Vitals Value Taken Time  BP 178/107 05/20/19 1443  Temp    Pulse 66 05/20/19 1445  Resp 14 05/20/19 1445  SpO2 100 % 05/20/19 1445  Vitals shown include unvalidated device data.  Last Pain:  Vitals:   05/20/19 1048  TempSrc: Oral  PainSc: 4       Patients Stated Pain Goal: 3 (05/20/19 1048)  Complications: No apparent anesthesia complications

## 2019-05-20 NOTE — Anesthesia Procedure Notes (Signed)
Procedure Name: Intubation Performed by: Sherrill Buikema M, CRNA Pre-anesthesia Checklist: Patient identified, Emergency Drugs available, Suction available and Patient being monitored Patient Re-evaluated:Patient Re-evaluated prior to induction Oxygen Delivery Method: Circle system utilized Preoxygenation: Pre-oxygenation with 100% oxygen Induction Type: IV induction Ventilation: Mask ventilation without difficulty Laryngoscope Size: Mac and 3 Grade View: Grade I Tube type: Oral Tube size: 7.0 mm Number of attempts: 1 Airway Equipment and Method: Stylet and Oral airway Placement Confirmation: ETT inserted through vocal cords under direct vision,  positive ETCO2 and breath sounds checked- equal and bilateral Secured at: 22 cm Tube secured with: Tape Dental Injury: Teeth and Oropharynx as per pre-operative assessment        

## 2019-05-20 NOTE — Progress Notes (Signed)
Assisted Dr. Witman with left, ultrasound guided, interscalene  block. Side rails up, monitors on throughout procedure. See vital signs in flow sheet. Tolerated Procedure well. 

## 2019-05-20 NOTE — H&P (Signed)
Kathleen Obrien is an 64 y.o. female.   Chief Complaint: L shoulder pain and dysfunction  HPI: Endstage L shoulder arthritis with significant pain and dysfunction, failed conservative measures.  Pain interferes with sleep and quality of life.   Past Medical History:  Diagnosis Date  . Coronary artery disease    per cardiac cath 03-01-2011 (done at [facility]) mild nonobstructive cad  . DJD (degenerative joint disease) of knee    right and left  . Headache    for the past 2 days ," i think its because i dont have my glasses on all the time"   . Hyperlipidemia   . Hypertension   . Pre-diabetes    "im not prediabetic anymore, my dotor took me off the medicine because i lost weight and he said i didnt need it anymore"   . Primary generalized (osteo)arthritis   . S/P gastric bypass 2018    Past Surgical History:  Procedure Laterality Date  . CARDIAC CATHETERIZATION  03-01-2011   [facility]   mild CAD, normal LVF (pCFx 30%),  ef 65%  . CESAREAN SECTION  1980  . JOINT REPLACEMENT    . LAPAROSCOPIC GASTRIC SLEEVE RESECTION  2018   [place]  . TOTAL KNEE ARTHROPLASTY Right 10/24/2017   Procedure: RIGHT TOTAL KNEE ARTHROPLASTY;  Surgeon: Dorna Leitz, MD;  Location: [facility] ORS;  Service: Orthopedics;  Laterality: Right;  Adductor Block  . TOTAL KNEE ARTHROPLASTY Left 01/30/2018   Procedure: LEFT TOTAL KNEE ARTHROPLASTY;  Surgeon: Dorna Leitz, MD;  Location: [facility] ORS;  Service: Orthopedics;  Laterality: Left;  [name] VAGINAL HYSTERECTOMY  1988    Family History  Problem Relation Age of Onset  . Arthritis Other    Social History:  reports that she has never smoked. She has never used smokeless tobacco. She reports current alcohol use. She reports that she does not use drugs.  Allergies:  Allergies  Allergen Reactions  . Banana Nausea And Vomiting    Medications Prior to Admission  Medication Sig Dispense Refill  . atenolol (TENORMIN) 50 MG tablet Take 50 mg by mouth 2 (two) times daily.     .  calcium-vitamin D 250-100 MG-UNIT tablet Take 1 tablet by mouth daily.     [name] gabapentin (NEURONTIN) 300 MG capsule Take 300 mg by mouth 4 (four) times daily.    [name] ibuprofen (ADVIL) 400 MG tablet Take 400 mg by mouth every 6 (six) hours as needed.    [name] losartan (COZAAR) 50 MG tablet Take 50 mg by mouth daily.    . meloxicam (MOBIC) 15 MG tablet Take 15 mg by mouth daily.    . Multiple Vitamins-Minerals (BARIATRIC MULTIVITAMINS/IRON PO) Take 1 capsule by mouth daily.    [name] omeprazole (PRILOSEC) 20 MG capsule Take 20 mg by mouth 2 (two) times daily.  3  . pravastatin (PRAVACHOL) 10 MG tablet Take 10 mg by mouth at bedtime.  3  . tiZANidine (ZANAFLEX) 4 MG tablet Take 1 tablet (4 mg total) by mouth every 8 (eight) hours as needed for muscle spasms. (Patient taking differently: Take 4 mg by mouth every 8 (eight) hours. ) 40 tablet 0  . traZODone (DESYREL) 50 MG tablet Take 50 mg by mouth at bedtime.    [name] NITROSTAT 0.4 MG SL tablet Place 0.4 mg under the tongue every 5 (five) minutes as needed for chest pain.       Results for orders placed or performed during the hospital encounter of 05/20/19 (from the past 48  hour(s))  APTT     Status: None   Collection Time: 05/18/19 12:00 PM  Result Value Ref Range   aPTT 32 24 - 36 seconds    Comment: Performed at [facility], [address] [place], [place] [address]  CBC WITH DIFFERENTIAL     Status: Abnormal   Collection Time: 05/18/19 12:00 PM  Result Value Ref Range   WBC 2.7 (L) 4.0 - 10.5 K/uL   RBC 4.00 3.87 - 5.11 MIL/uL   Hemoglobin 12.6 12.0 - 15.0 g/dL   HCT 59.1 63.8 - 46.6 %   MCV 95.0 80.0 - 100.0 fL   MCH 31.5 26.0 - 34.0 pg   MCHC 33.2 30.0 - 36.0 g/dL   RDW 59.9 35.7 - 01.7 %   Platelets 208 150 - 400 K/uL   nRBC 0.0 0.0 - 0.2 %   Neutrophils Relative % 35 %   Neutro Abs 0.9 (L) 1.7 - 7.7 K/uL   Lymphocytes Relative 58 %   Lymphs Abs 1.6 0.7 - 4.0 K/uL   Monocytes Relative 3 %   Monocytes Absolute 0.1 0.1 - 1.0 K/uL    Eosinophils Relative 1 %   Eosinophils Absolute 0.0 0.0 - 0.5 K/uL   Basophils Relative 3 %   Basophils Absolute 0.1 0.0 - 0.1 K/uL   nRBC 0 0 /100 WBC   Abs Immature Granulocytes 0.00 0.00 - 0.07 K/uL    Comment: Performed at [facility], [address] [place], [place] [address]  Comprehensive metabolic panel     Status: None   Collection Time: 05/18/19 12:00 PM  Result Value Ref Range   Sodium 145 135 - 145 mmol/L   Potassium 4.0 3.5 - 5.1 mmol/L   Chloride 106 98 - 111 mmol/L   CO2 28 22 - 32 mmol/L   Glucose, Bld 91 70 - 99 mg/dL   BUN 11 8 - 23 mg/dL   Creatinine, Ser 3.00 0.44 - 1.00 mg/dL   Calcium 8.9 8.9 - 92.3 mg/dL   Total Protein 6.6 6.5 - 8.1 g/dL   Albumin 3.9 3.5 - 5.0 g/dL   AST 18 15 - 41 U/L   ALT 12 0 - 44 U/L   Alkaline Phosphatase 54 38 - 126 U/L   Total Bilirubin 0.9 0.3 - 1.2 mg/dL   GFR calc non Af Amer >60 >60 mL/min   GFR calc Af Amer >60 >60 mL/min   Anion gap 11 5 - 15    Comment: Performed at [facility], [address], [place], [place] [address]  Protime-INR     Status: None   Collection Time: 05/18/19 12:00 PM  Result Value Ref Range   Prothrombin Time 13.1 11.4 - 15.2 seconds   INR 1.0 0.8 - 1.2    Comment: (NOTE) INR goal varies based on device and disease states. Performed at [facility], [address], [place], [place] [address]    DG Chest 2 View  Result Date: 05/18/2019 CLINICAL DATA:  Preoperative assessment for shoulder surgery EXAM: CHEST - 2 VIEW COMPARISON:  July 04, 2005 FINDINGS: Lungs are clear. Heart size and pulmonary vascularity are normal. No adenopathy. There is degenerative change in the thoracic spine and in each shoulder. There is thoracolumbar levoscoliosis. IMPRESSION: Lungs clear. Cardiac silhouette normal. No adenopathy. Areas of arthropathy as noted. Electronically Signed   By: Bretta Bang III M.D.   On: 05/18/2019 16:48    Review of Systems  All other systems reviewed and are  negative.   Blood pressure (!) 154/89, pulse (!) 57, temperature 97.7 F (36.5 C), temperature source Oral, resp. rate 18, height 5\' 8"  (1.727 m), weight 127.5 kg, SpO2 98 %. Physical Exam  Constitutional: She is oriented to person, place, and time. She appears well-developed and well-nourished.  HENT:  Head: Atraumatic.  Eyes: EOM are normal.  Cardiovascular: Intact distal pulses.  Respiratory: Effort normal.  Musculoskeletal:     Comments: L shoulder pain with limited ROM. NVID.  Neurological: She is alert and oriented to person, place, and time.  Skin: Skin is warm and dry.  Psychiatric: She has a normal mood and affect.     Assessment/Plan L shoulder advanced arthritis, failed conservative tx Plan L TSA Risks / benefits of surgery discussed Consent on chart  NPO for OR Preop antibiotics   , MD 05/20/2019, 11:39 AM

## 2019-05-20 NOTE — Op Note (Signed)
Procedure(s): TOTAL SHOULDER ARTHROPLASTY Procedure Note  Kathleen Obrien female 64 y.o. 05/20/2019   Preoperative diagnosis: Left shoulder end-stage osteoarthritis  Postoperative diagnosis: Same  Procedure(s) and Anesthesia Type:    Left TOTAL SHOULDER ARTHROPLASTY - General  Surgeon(s) and Role:    Jones Broom, MD - Primary   Indications:  64 y.o. female  With endstage left shoulder arthritis. Pain and dysfunction interfered with quality of life and nonoperative treatment with activity modification, NSAIDS and injections failed.     Surgeon: Berline Lopes   Assistants: Damita Lack PA-C [facility] was present and scrubbed throughout the procedure and was essential in positioning, retraction, exposure, and closure)  Anesthesia: General endotracheal anesthesia with preoperative interscalene block given by the attending anesthesiologist    Procedure Detail  TOTAL SHOULDER ARTHROPLASTY  Findings: Tornier flex anatomic press-fit size 2 stem with a 52 x 19 head, cemented size large 50 Cortiloc glenoid.   A lesser tuberosity osteotomy was performed and repaired at the conclusion of the procedure.  Bone quality was noted to be very poor and a small lateral hand plate was used to help secure the sutures for the lesser tuberosity fixation  Estimated Blood Loss:  200 mL         Drains: None   Blood Given: none          Specimens: none        Complications:  * No complications entered in OR log *         Disposition: PACU - hemodynamically stable.         Condition: stable    Procedure:   The patient was identified in the preoperative holding area where I personally marked the operative extremity after verifying with the patient and consent. She  was taken to the operating room where She was transferred to the   operative table.  The patient received an interscalene block in   the holding area by the attending anesthesiologist.  General anesthesia was  induced   in the operating room without complication.  The patient did receive IV  Ancef prior to the commencement of the procedure.  The patient was   placed in the beach-chair position with the back raised about 30   degrees.  The nonoperative extremity and head and neck were carefully   positioned and padded protecting against neurovascular compromise.  The   left upper extremity was then prepped and draped in the standard sterile   fashion.    The appropriate operative time-out was performed with   Anesthesia, the perioperative staff, as well as myself and we all agreed   that the left side was the correct operative site.  An approximately   10 cm incision was made from the tip of the coracoid to the center point of the   humerus at the level of the axilla.  Dissection was carried down sharply   through subcutaneous tissues and cephalic vein was identified and taken   laterally with the deltoid.  The pectoralis major was taken medially.  The   upper 1 cm of the pectoralis major was released from its attachment on   the humerus.  The clavipectoral fascia was incised just lateral to the   conjoined tendon.  This incision was carried up to but not into the   coracoacromial ligament.  Digital palpation was used to prove   integrity of the axillary nerve which was protected throughout the   procedure.  Musculocutaneous nerve was not  palpated in the operative   field.  Conjoined tendon was then retracted gently medially and the   deltoid laterally.  Anterior circumflex humeral vessels were clamped and   coagulated.  The soft tissues overlying the biceps was incised and this   incision was carried across the transverse humeral ligament to the base   of the coracoid.  The biceps was noted to be severely degenerated. It was released from the superior labrum. The biceps was then tenodesed to the soft tissue just above   pectoralis major and the remaining portion of the biceps superiorly was    excised.  An osteotomy was performed at the lesser tuberosity.  The capsule was then   released all the way down to the 6 o'clock position of the humeral head.   The humeral head was then delivered with simultaneous adduction,   extension and external rotation.  All humeral osteophytes were removed   and the anatomic neck of the humerus was marked and cut free hand at   approximately 25 degrees retroversion within about 3 mm of the cuff   reflection posteriorly.  The head size was estimated to be a 52 medium   offset.  At that point, the humeral head was retracted posteriorly with   a Fukuda retractor.   Remaining portion of the capsule was released at the base of the   coracoid.  The remaining biceps anchor and the entire anterior-inferior   labrum was excised.  The posterior labrum was also excised but the   posterior capsule was not released.  The guidepin was placed bicortically with none elevated guide.  The reamer was used to ream to concentric bone with punctate bleeding.  This gave an excellent concentric surface.  The center hole was then drilled for an anchor peg glenoid followed by the three peripheral holes and none of the holes   exited the glenoid wall.  I then pulse irrigated these holes and dried   them with Surgicel.  The three peripheral holes were then   pressurized cemented and the anchor peg glenoid was placed and impacted   with an excellent fit.  The glenoid was a 50 large component.  The proximal humerus was then again exposed taking care not to displace the glenoid.    The entry awl was used followed by sounding reamers and then sequentially broached from size 1-2 this was then left in place and the calcar planer was used. Trial head was placed with a 52 x 19.  With the trial implantation of the component,  there was approximately 50% posterior translation with immediate snap back to the   anatomic position.  With forward elevation, there was no tendency   towards  posterior subluxation.   The trial was removed and the final implant was prepared on a back table.  The trial was removed and the final implant was prepared on a back table.   3 small holes were drilled on the medial side of the lesser tuberosity osteotomy, through which 2 labral tapes were passed. The implant was then placed through the loop of the 2 labral tapes and impacted with an excellent press-fit. This achieved excellent anatomic reconstruction of the proximal humerus.  The joint was then copiously irrigated with pulse lavage.  The subscapularis and   lesser tuberosity osteotomy were then repaired using the 2 labral tapes previously passed in a double row fashion with horizontal mattress sutures medially brought over through bone tunnels tied over a bone bridge  laterally.  Bone quality was noted to be very poor and so a small mini fragment hand plate was used to pass the stitches through one tie over top of to take the stress off of the cortical bone. One #1 Ethibond was placed at the rotator interval just above   the lesser tuberosity. Copious irrigation was used. Skin was closed with 2-0 Vicryl sutures in the deep dermal layer and 4-0 Monocryl in a subcuticular  running fashion.  Sterile dressings were then applied including Aquacel.  The patient was placed in a sling and allowed to awaken from general anesthesia and taken to the recovery room in stable condition.      POSTOPERATIVE PLAN: Given her poor bone quality we will hold on early range of motion exercises and let things heal in a bit first.  She will be observed in the recovery room today and if she has a good block and physiologically doing well we should be able to send her home with her family.

## 2019-05-20 NOTE — Anesthesia Preprocedure Evaluation (Addendum)
Anesthesia Evaluation  Patient identified by MRN, date of birth, ID band Patient awake    Reviewed: Allergy & Precautions, NPO status , Patient's Chart, lab work & pertinent test results  History of Anesthesia Complications Negative for: history of anesthetic complications  Airway Mallampati: II  TM Distance: >3 FB Neck ROM: Full    Dental  (+) Teeth Intact   Pulmonary neg pulmonary ROS,    Pulmonary exam normal        Cardiovascular hypertension, + CAD  Normal cardiovascular exam     Neuro/Psych negative neurological ROS  negative psych ROS   GI/Hepatic negative GI ROS, Neg liver ROS,   Endo/Other  Morbid obesity (s/p gastric bypass 2018- BMI 42)  Renal/GU negative Renal ROS  negative genitourinary   Musculoskeletal  (+) Arthritis ,   Abdominal   Peds  Hematology negative hematology ROS (+)   Anesthesia Other Findings Cardiac cath 2012: mild CAD, normal LV function  Reproductive/Obstetrics                            Anesthesia Physical Anesthesia Plan  ASA: III  Anesthesia Plan: General   Post-op Pain Management: GA combined w/ Regional for post-op pain   Induction: Intravenous  PONV Risk Score and Plan: 3 and Ondansetron, Dexamethasone, Treatment may vary due to age or medical condition and Midazolam  Airway Management Planned: Oral ETT  Additional Equipment: None  Intra-op Plan:   Post-operative Plan: Extubation in OR  Informed Consent: I have reviewed the patients History and Physical, chart, labs and discussed the procedure including the risks, benefits and alternatives for the proposed anesthesia with the patient or authorized representative who has indicated his/her understanding and acceptance.     Dental advisory given  Plan Discussed with:   Anesthesia Plan Comments:        Anesthesia Quick Evaluation

## 2019-05-20 NOTE — Discharge Instructions (Signed)
Discharge Instructions after Total Shoulder Arthroplasty ° ° °A sling has been provided for you. You are to wear this at all times, even while sleeping, until your first post operative visit with Dr. Chandler. °Use ice on the shoulder intermittently over the first 48 hours after surgery.  °Pain medicine has been prescribed for you.  °Use your medicine liberally over the first 48 hours, and then you can begin to taper your use. You may take Extra Strength Tylenol or Tylenol only in place of the pain pills. DO NOT take ANY nonsteroidal anti-inflammatory pain medications: Advil, Motrin, Ibuprofen, Aleve, Naproxen or Naprosyn.  °Take one aspirin a day for 2 weeks after surgery, unless you have an aspirin sensitivity/allergy or asthma.  °Leave your dressing on until your first follow up visit.  You may shower with the dressing.  Hold your arm as if you still have your sling on while you shower. °Simply allow the water to wash over the site and then pat dry. Make sure your axilla (armpit) is completely dry after showering. ° ° ° °Please call [phone] during normal business hours or [phone] after hours for any problems. Including the following: ° °- excessive redness of the incisions °- drainage for more than 4 days °- fever of more than 101.5 F ° °*Please note that pain medications will not be refilled after hours or on weekends. ° ° ° ° °

## 2019-05-20 NOTE — Anesthesia Procedure Notes (Signed)
Anesthesia Regional Block: Interscalene brachial plexus block   Pre-Anesthetic Checklist: ,, timeout performed, Correct Patient, Correct Site, Correct Laterality, Correct Procedure, Correct Position, site marked, Risks and benefits discussed,  Surgical consent,  Pre-op evaluation,  At surgeon's request and post-op pain management  Laterality: Left  Prep: chloraprep       Needles:  Injection technique: Single-shot  Needle Type: Echogenic Stimulator Needle     Needle Length: 9cm  Needle Gauge: 21     Additional Needles:   Procedures:,,,, ultrasound used (permanent image in chart),,,,  Narrative:  Start time: 05/20/2019 12:00 PM End time: 05/20/2019 12:05 PM Injection made incrementally with aspirations every 5 mL.  Performed by: Personally  Anesthesiologist: Lucretia Kern, MD  Additional Notes: Monitors applied. Injection made in 5cc increments. No resistance to injection. Good needle visualization. Patient tolerated procedure well.

## 2019-05-21 ENCOUNTER — Encounter: Payer: Self-pay | Admitting: *Deleted

## 2019-05-21 NOTE — Anesthesia Postprocedure Evaluation (Signed)
Anesthesia Post Note  Patient: Kathleen Obrien  Procedure(s) Performed: TOTAL SHOULDER ARTHROPLASTY (Left Shoulder)     Patient location during evaluation: PACU Anesthesia Type: General Level of consciousness: awake and alert Pain management: pain level controlled Vital Signs Assessment: post-procedure vital signs reviewed and stable Respiratory status: spontaneous breathing, nonlabored ventilation and respiratory function stable Cardiovascular status: blood pressure returned to baseline and stable Postop Assessment: no apparent nausea or vomiting Anesthetic complications: no    Last Vitals:  Vitals:   05/20/19 1600 05/20/19 1700  BP: (!) 159/99   Pulse:    Resp:    Temp: 36.5 C   SpO2: 98% 98%    Last Pain:  Vitals:   05/20/19 1600  TempSrc:   PainSc: 3    Pain Goal: Patients Stated Pain Goal: 4 (05/20/19 1545)                 Lucretia Kern

## 2019-07-15 ENCOUNTER — Ambulatory Visit: Payer: Medicare Other

## 2019-08-02 ENCOUNTER — Other Ambulatory Visit: Payer: Self-pay | Admitting: Orthopedic Surgery

## 2019-08-02 DIAGNOSIS — M25511 Pain in right shoulder: Secondary | ICD-10-CM

## 2019-08-02 DIAGNOSIS — M19019 Primary osteoarthritis, unspecified shoulder: Secondary | ICD-10-CM

## 2019-08-05 ENCOUNTER — Other Ambulatory Visit: Payer: Self-pay | Admitting: Orthopedic Surgery

## 2019-08-05 DIAGNOSIS — M25511 Pain in right shoulder: Secondary | ICD-10-CM

## 2019-08-17 ENCOUNTER — Other Ambulatory Visit: Payer: Medicare Other

## 2019-08-20 ENCOUNTER — Ambulatory Visit
Admission: RE | Admit: 2019-08-20 | Discharge: 2019-08-20 | Disposition: A | Discharge status: Home / Self Care | Payer: Medicare Other | Source: Ambulatory Visit | Attending: Orthopedic Surgery | Admitting: Orthopedic Surgery

## 2019-08-20 DIAGNOSIS — M25511 Pain in right shoulder: Secondary | ICD-10-CM

## 2019-09-15 ENCOUNTER — Other Ambulatory Visit: Payer: Self-pay | Admitting: Orthopedic Surgery

## 2019-09-21 NOTE — Patient Instructions (Addendum)
DUE TO COVID-19 ONLY ONE VISITOR IS ALLOWED TO COME WITH YOU AND STAY IN THE WAITING ROOM ONLY DURING PRE OP AND PROCEDURE DAY OF SURGERY. THE 1 VISITOR MAY VISIT WITH YOU AFTER SURGERY IN YOUR PRIVATE ROOM DURING VISITING HOURS ONLY!  YOU NEED TO HAVE A COVID 19 TEST ON: 6/2/121, THIS TEST MUST BE DONE BEFORE SURGERY, COME  [address], [place] , [address].  [facility]) ONCE YOUR COVID TEST IS COMPLETED, PLEASE BEGIN THE QUARANTINE INSTRUCTIONS AS OUTLINED IN YOUR HANDOUT.                Kathleen Obrien    Your procedure is scheduled on: 09/23/19   Report to [facility] Main  Entrance   Report to short stay at: 5:30 AM     Call this number if you have problems the morning of surgery [phone]    Remember:   NO SOLID FOOD AFTER MIDNIGHT THE NIGHT PRIOR TO SURGERY. NOTHING BY MOUTH EXCEPT CLEAR LIQUIDS UNTIL: 4:30 am . PLEASE FINISH GATORADE DRINK PER SURGEON ORDER  WHICH NEEDS TO BE COMPLETED AT: 4:30 am .   CLEAR LIQUID DIET   Foods Allowed                                                                     Foods Excluded  Coffee and tea, regular and decaf                             liquids that you cannot  Plain Jell-O any favor except red or purple                                           see through such as: Fruit ices (not with fruit pulp)                                     milk, soups, orange juice  Iced Popsicles                                    All solid food Carbonated beverages, regular and diet                                    Cranberry, grape and apple juices Sports drinks like Gatorade Lightly seasoned clear broth or consume(fat free) Sugar, honey syrup  Sample Menu Breakfast                                Lunch                                     Supper Cranberry juice  Beef broth                            Chicken broth Jell-O                                     Grape juice                            Apple juice Coffee or tea                        Jell-O                                      Popsicle                                                Coffee or tea                        Coffee or tea  _____________________________________________________________________   BRUSH YOUR TEETH MORNING OF SURGERY AND RINSE YOUR MOUTH OUT, NO CHEWING GUM CANDY OR MINTS.     Take these medicines the morning of surgery with A SIP OF WATER: amlodipine,omeprazole.                                 You may not have any metal on your body including hair pins and              piercings  Do not wear jewelry, make-up, lotions, powders or perfumes, deodorant             Do not wear nail polish on your fingernails.  Do not shave  48 hours prior to surgery.               Do not bring valuables to the hospital. [place].  Contacts, dentures or bridgework may not be worn into surgery.  Leave suitcase in the car. After surgery it may be brought to your room.     Patients discharged the day of surgery will not be allowed to drive home. IF YOU ARE HAVING SURGERY AND GOING HOME THE SAME DAY, YOU MUST HAVE AN ADULT TO DRIVE YOU HOME AND BE WITH YOU FOR 24 HOURS. YOU MAY GO HOME BY TAXI OR UBER OR ORTHERWISE, BUT AN ADULT MUST ACCOMPANY YOU HOME AND STAY WITH YOU FOR 24 HOURS.  Name and phone number of your driver:  Special Instructions: N/A              Please read over the following fact sheets you were given: _____________________________________________________________________             [facility]- Preparing for Total Shoulder Arthroplasty    Before surgery, you can play an important role. Because skin is not sterile, your skin needs to be as free of germs as possible. You can reduce the  number of germs on your skin by using the following products. . Benzoyl Peroxide Gel o Reduces the number of germs present on the skin o Applied twice a day to  shoulder area starting two days before surgery    ==================================================================  Please follow these instructions carefully:  BENZOYL PEROXIDE 5% GEL  Please do not use if you have an allergy to benzoyl peroxide.   If your skin becomes reddened/irritated stop using the benzoyl peroxide.  Starting two days before surgery, apply as follows: 1. Apply benzoyl peroxide in the morning and at night. Apply after taking a shower. If you are not taking a shower clean entire shoulder front, back, and side along with the armpit with a clean wet washcloth.  2. Place a quarter-sized dollop on your shoulder and rub in thoroughly, making sure to cover the front, back, and side of your shoulder, along with the armpit.   2 days before ____ AM   ____ PM              1 day before ____ AM   ____ PM                         3. Do this twice a day for two days.  (Last application is the night before surgery, AFTER using the CHG soap as described below).  4. Do NOT apply benzoyl peroxide gel on the day of surgery.  [facility] - Preparing for Surgery Before surgery, you can play an important role.  Because skin is not sterile, your skin needs to be as free of germs as possible.  You can reduce the number of germs on your skin by washing with CHG (chlorahexidine gluconate) soap before surgery.  CHG is an antiseptic cleaner which kills germs and bonds with the skin to continue killing germs even after washing. Please DO NOT use if you have an allergy to CHG or antibacterial soaps.  If your skin becomes reddened/irritated stop using the CHG and inform your nurse when you arrive at Short Stay. Do not shave (including legs and underarms) for at least 48 hours prior to the first CHG shower.  You may shave your face/neck. Please follow these instructions carefully:  1.  Shower with CHG Soap the night before surgery and the  morning of Surgery.  2.  If you choose to wash your hair,  wash your hair first as usual with your  normal  shampoo.  3.  After you shampoo, rinse your hair and body thoroughly to remove the  shampoo.                           4.  Use CHG as you would any other liquid soap.  You can apply chg directly  to the skin and wash                       Gently with a scrungie or clean washcloth.  5.  Apply the CHG Soap to your body ONLY FROM THE NECK DOWN.   Do not use on face/ open                           Wound or open sores. Avoid contact with eyes, ears mouth and genitals (private parts).  Wash face,  Genitals (private parts) with your normal soap.             6.  Wash thoroughly, paying special attention to the area where your surgery  will be performed.  7.  Thoroughly rinse your body with warm water from the neck down.  8.  DO NOT shower/wash with your normal soap after using and rinsing off  the CHG Soap.                9.  Pat yourself dry with a clean towel.            10.  Wear clean pajamas.            11.  Place clean sheets on your bed the night of your first shower and do not  sleep with pets. Day of Surgery : Do not apply any lotions/deodorants the morning of surgery.  Please wear clean clothes to the hospital/surgery center.  FAILURE TO FOLLOW THESE INSTRUCTIONS MAY RESULT IN THE CANCELLATION OF YOUR SURGERY PATIENT SIGNATURE_________________________________  NURSE SIGNATURE__________________________________  ________________________________________________________________________   Kathleen Obrien  An incentive spirometer is a tool that can help keep your lungs clear and active. This tool measures how well you are filling your lungs with each breath. Taking long deep breaths may help reverse or decrease the chance of developing breathing (pulmonary) problems (especially infection) following:  A long period of time when you are unable to move or be active. BEFORE THE PROCEDURE   If the spirometer includes an  indicator to show your best effort, your nurse or respiratory therapist will set it to a desired goal.  If possible, sit up straight or lean slightly forward. Try not to slouch.  Hold the incentive spirometer in an upright position. INSTRUCTIONS FOR USE  1. Sit on the edge of your bed if possible, or sit up as far as you can in bed or on a chair. 2. Hold the incentive spirometer in an upright position. 3. Breathe out normally. 4. Place the mouthpiece in your mouth and seal your lips tightly around it. 5. Breathe in slowly and as deeply as possible, raising the piston or the ball toward the top of the column. 6. Hold your breath for 3-5 seconds or for as long as possible. Allow the piston or ball to fall to the bottom of the column. 7. Remove the mouthpiece from your mouth and breathe out normally. 8. Rest for a few seconds and repeat Steps 1 through 7 at least 10 times every 1-2 hours when you are awake. Take your time and take a few normal breaths between deep breaths. 9. The spirometer may include an indicator to show your best effort. Use the indicator as a goal to work toward during each repetition. 10. After each set of 10 deep breaths, practice coughing to be sure your lungs are clear. If you have an incision (the cut made at the time of surgery), support your incision when coughing by placing a pillow or rolled up towels firmly against it. Once you are able to get out of bed, walk around indoors and cough well. You may stop using the incentive spirometer when instructed by your caregiver.  RISKS AND COMPLICATIONS  Take your time so you do not get dizzy or light-headed.  If you are in pain, you may need to take or ask for pain medication before doing incentive spirometry. It is harder to take a deep breath if you are having pain.  AFTER USE  Rest and breathe slowly and easily.  It can be helpful to keep track of a log of your progress. Your caregiver can provide you with a simple table  to help with this. If you are using the spirometer at home, follow these instructions: [place] IF:   You are having difficultly using the spirometer.  You have trouble using the spirometer as often as instructed.  Your pain medication is not giving enough relief while using the spirometer.  You develop fever of 100.5 F (38.1 C) or higher. SEEK IMMEDIATE MEDICAL CARE IF:   You cough up bloody sputum that had not been present before.  You develop fever of 102 F (38.9 C) or greater.  You develop worsening pain at or near the incision site. MAKE SURE YOU:   Understand these instructions.  Will watch your condition.  Will get help right away if you are not doing well or get worse. Document Released: 08/19/2006 Document Revised: 07/01/2011 Document Reviewed: 10/20/2006 [facility] Patient Information 2014 [place], [place].   ________________________________________________________________________

## 2019-09-22 ENCOUNTER — Encounter (HOSPITAL_COMMUNITY)
Admission: RE | Admit: 2019-09-22 | Discharge: 2019-09-22 | Disposition: A | Discharge status: Home / Self Care | Payer: Medicare Other | Source: Ambulatory Visit | Attending: Orthopedic Surgery | Admitting: Orthopedic Surgery

## 2019-09-22 ENCOUNTER — Encounter (HOSPITAL_COMMUNITY): Payer: Self-pay

## 2019-09-22 ENCOUNTER — Other Ambulatory Visit (HOSPITAL_COMMUNITY)
Admission: RE | Admit: 2019-09-22 | Discharge: 2019-09-22 | Disposition: A | Discharge status: Home / Self Care | Payer: Medicare Other | Source: Ambulatory Visit | Attending: Orthopedic Surgery | Admitting: Orthopedic Surgery

## 2019-09-22 ENCOUNTER — Other Ambulatory Visit: Payer: Self-pay

## 2019-09-22 DIAGNOSIS — Z20822 Contact with and (suspected) exposure to covid-19: Secondary | ICD-10-CM | POA: Insufficient documentation

## 2019-09-22 DIAGNOSIS — Z01812 Encounter for preprocedural laboratory examination: Secondary | ICD-10-CM | POA: Diagnosis present

## 2019-09-22 HISTORY — DX: Depression, unspecified: F32.A

## 2019-09-22 LAB — TYPE AND SCREEN
ABO/RH(D): O POS
Antibody Screen: NEGATIVE

## 2019-09-22 LAB — COMPREHENSIVE METABOLIC PANEL
ALT: 14 U/L (ref 0–44)
AST: 21 U/L (ref 15–41)
Albumin: 4.1 g/dL (ref 3.5–5.0)
Alkaline Phosphatase: 57 U/L (ref 38–126)
Anion gap: 8 (ref 5–15)
BUN: 17 mg/dL (ref 8–23)
CO2: 30 mmol/L (ref 22–32)
Calcium: 9 mg/dL (ref 8.9–10.3)
Chloride: 102 mmol/L (ref 98–111)
Creatinine, Ser: 0.75 mg/dL (ref 0.44–1.00)
GFR calc Af Amer: >60 mL/min (ref >60)
GFR calc non Af Amer: >60 mL/min (ref >60)
Glucose, Bld: 95 mg/dL (ref 70–99)
Potassium: 3.8 mmol/L (ref 3.5–5.1)
Sodium: 140 mmol/L (ref 135–145)
Total Bilirubin: 0.9 mg/dL (ref 0.3–1.2)
Total Protein: 6.7 g/dL (ref 6.5–8.1)

## 2019-09-22 LAB — APTT: aPTT: 33 seconds (ref 24–36)

## 2019-09-22 LAB — CBC WITH DIFFERENTIAL/PLATELET
Abs Immature Granulocytes: 0 10*3/uL (ref 0.00–0.07)
Basophils Absolute: 0 10*3/uL (ref 0.0–0.1)
Basophils Relative: 1 %
Eosinophils Absolute: 0.1 10*3/uL (ref 0.0–0.5)
Eosinophils Relative: 3 %
HCT: 38 % (ref 36.0–46.0)
Hemoglobin: 12.4 g/dL (ref 12.0–15.0)
Immature Granulocytes: 0 %
Lymphocytes Relative: 58 %
Lymphs Abs: 1.9 10*3/uL (ref 0.7–4.0)
MCH: 31.6 pg (ref 26.0–34.0)
MCHC: 32.6 g/dL (ref 30.0–36.0)
MCV: 96.7 fL (ref 80.0–100.0)
Monocytes Absolute: 0.2 10*3/uL (ref 0.1–1.0)
Monocytes Relative: 7 %
Neutro Abs: 1 10*3/uL — ABNORMAL LOW (ref 1.7–7.7)
Neutrophils Relative %: 31 %
Platelets: 211 10*3/uL (ref 150–400)
RBC: 3.93 MIL/uL (ref 3.87–5.11)
RDW: 12.3 % (ref 11.5–15.5)
WBC: 3.2 10*3/uL — ABNORMAL LOW (ref 4.0–10.5)
nRBC: 0 % (ref 0.0–0.2)

## 2019-09-22 LAB — URINALYSIS, ROUTINE W REFLEX MICROSCOPIC
Bacteria, UA: NONE SEEN
Bilirubin Urine: NEGATIVE
Glucose, UA: NEGATIVE mg/dL
Hgb urine dipstick: NEGATIVE
Ketones, ur: NEGATIVE mg/dL
Nitrite: NEGATIVE
Protein, ur: NEGATIVE mg/dL
Specific Gravity, Urine: 1.016 (ref 1.005–1.030)
pH: 6 (ref 5.0–8.0)

## 2019-09-22 LAB — PROTIME-INR
INR: 1 (ref 0.8–1.2)
Prothrombin Time: 12.9 seconds (ref 11.4–15.2)

## 2019-09-22 LAB — HEMOGLOBIN A1C
Hgb A1c MFr Bld: 5.7 % — ABNORMAL HIGH (ref 4.8–5.6)
Mean Plasma Glucose: 116.89 mg/dL

## 2019-09-22 LAB — SURGICAL PCR SCREEN
MRSA, PCR: NEGATIVE
Staphylococcus aureus: NEGATIVE

## 2019-09-22 LAB — SARS CORONAVIRUS 2 (TAT 6-24 HRS): SARS Coronavirus 2: NEGATIVE

## 2019-09-22 MED ORDER — DEXTROSE 5 % IV SOLN
3.0000 g | INTRAVENOUS | Status: AC
  Administered 2019-09-23: 3 g via INTRAVENOUS
  Filled 2019-09-22: qty 3

## 2019-09-22 NOTE — Progress Notes (Signed)
COVID Vaccine Completed: yes Date COVID Vaccine completed: 09/07/19 COVID vaccine manufacturer: Pfizer   * [employer]   PCP - Dr Carollee Massed Cardiologist -   Chest x-ray - 05/18/19 EPIC EKG - 05/18/19 EPIC Stress Test -  ECHO -  Cardiac Cath -   Sleep Study - yes CPAP - no  Fasting Blood Sugar -  Checks Blood Sugar _____ times a day  Blood Thinner Instructions: Aspirin Instructions: Last Dose:  Anesthesia review: Hx: CAD,HTN  Patient denies shortness of breath, fever, cough and chest pain at PAT appointment   Patient verbalized understanding of instructions that were given to them at the PAT appointment. Patient was also instructed that they will need to review over the PAT instructions again at home before surgery.

## 2019-09-23 ENCOUNTER — Encounter (HOSPITAL_COMMUNITY)
Admission: RE | Disposition: A | Discharge status: Home / Self Care | Payer: Self-pay | Source: Ambulatory Visit | Attending: Orthopedic Surgery

## 2019-09-23 ENCOUNTER — Other Ambulatory Visit: Payer: Self-pay

## 2019-09-23 ENCOUNTER — Observation Stay (HOSPITAL_COMMUNITY)
Admission: RE | Admit: 2019-09-23 | Discharge: 2019-09-24 | Disposition: A | Discharge status: Home / Self Care | Payer: Medicare Other | Source: Ambulatory Visit | Attending: Orthopedic Surgery | Admitting: Orthopedic Surgery

## 2019-09-23 ENCOUNTER — Ambulatory Visit (HOSPITAL_COMMUNITY): Payer: Medicare Other | Admitting: Certified Registered Nurse Anesthetist

## 2019-09-23 ENCOUNTER — Telehealth (HOSPITAL_COMMUNITY): Payer: Self-pay | Admitting: *Deleted

## 2019-09-23 ENCOUNTER — Observation Stay (HOSPITAL_COMMUNITY): Payer: Medicare Other

## 2019-09-23 ENCOUNTER — Encounter (HOSPITAL_COMMUNITY): Payer: Self-pay | Admitting: Orthopedic Surgery

## 2019-09-23 DIAGNOSIS — F329 Major depressive disorder, single episode, unspecified: Secondary | ICD-10-CM | POA: Diagnosis not present

## 2019-09-23 DIAGNOSIS — I10 Essential (primary) hypertension: Secondary | ICD-10-CM | POA: Insufficient documentation

## 2019-09-23 DIAGNOSIS — M19011 Primary osteoarthritis, right shoulder: Principal | ICD-10-CM | POA: Insufficient documentation

## 2019-09-23 DIAGNOSIS — Z01811 Encounter for preprocedural respiratory examination: Secondary | ICD-10-CM

## 2019-09-23 DIAGNOSIS — Z96611 Presence of right artificial shoulder joint: Secondary | ICD-10-CM

## 2019-09-23 DIAGNOSIS — Z9884 Bariatric surgery status: Secondary | ICD-10-CM | POA: Insufficient documentation

## 2019-09-23 DIAGNOSIS — Z79899 Other long term (current) drug therapy: Secondary | ICD-10-CM | POA: Insufficient documentation

## 2019-09-23 DIAGNOSIS — Z96653 Presence of artificial knee joint, bilateral: Secondary | ICD-10-CM | POA: Insufficient documentation

## 2019-09-23 DIAGNOSIS — I2510 Atherosclerotic heart disease of native coronary artery without angina pectoris: Secondary | ICD-10-CM | POA: Insufficient documentation

## 2019-09-23 DIAGNOSIS — Z791 Long term (current) use of non-steroidal anti-inflammatories (NSAID): Secondary | ICD-10-CM | POA: Diagnosis not present

## 2019-09-23 DIAGNOSIS — E785 Hyperlipidemia, unspecified: Secondary | ICD-10-CM | POA: Insufficient documentation

## 2019-09-23 HISTORY — PX: TOTAL SHOULDER ARTHROPLASTY: SHX126

## 2019-09-23 SURGERY — ARTHROPLASTY, SHOULDER, TOTAL
Anesthesia: General | Site: Shoulder | Laterality: Right

## 2019-09-23 MED ORDER — METOCLOPRAMIDE HCL 5 MG PO TABS: 5.0000 mg | ORAL_TABLET | Freq: Three times a day (TID) | ORAL | Status: DC | PRN

## 2019-09-23 MED ORDER — ATENOLOL 50 MG PO TABS
50.0000 mg | ORAL_TABLET | Freq: Two times a day (BID) | ORAL | Status: DC
  Administered 2019-09-23 – 2019-09-24 (×2): 50 mg via ORAL
  Filled 2019-09-23 (×2): qty 1

## 2019-09-23 MED ORDER — LACTATED RINGERS IV SOLN: INTRAVENOUS | Status: DC

## 2019-09-23 MED ORDER — ONDANSETRON HCL 4 MG/2ML IJ SOLN: 4.0000 mg | Freq: Four times a day (QID) | INTRAMUSCULAR | Status: DC | PRN

## 2019-09-23 MED ORDER — DEXAMETHASONE SODIUM PHOSPHATE 10 MG/ML IJ SOLN
INTRAMUSCULAR | Status: AC
  Filled 2019-09-23: qty 1

## 2019-09-23 MED ORDER — MENTHOL 3 MG MT LOZG: 1.0000 | LOZENGE | OROMUCOSAL | Status: DC | PRN

## 2019-09-23 MED ORDER — METOCLOPRAMIDE HCL 5 MG/ML IJ SOLN: 5.0000 mg | Freq: Three times a day (TID) | INTRAMUSCULAR | Status: DC | PRN

## 2019-09-23 MED ORDER — ONDANSETRON HCL 4 MG/2ML IJ SOLN
INTRAMUSCULAR | Status: DC | PRN
  Administered 2019-09-23: 4 mg via INTRAVENOUS

## 2019-09-23 MED ORDER — PRAVASTATIN SODIUM 20 MG PO TABS
10.0000 mg | ORAL_TABLET | Freq: Every day | ORAL | Status: DC
  Administered 2019-09-23: 10 mg via ORAL
  Filled 2019-09-23: qty 1

## 2019-09-23 MED ORDER — SENNOSIDES-DOCUSATE SODIUM 8.6-50 MG PO TABS: 1.0000 | ORAL_TABLET | Freq: Every evening | ORAL | Status: DC | PRN

## 2019-09-23 MED ORDER — CEFAZOLIN SODIUM-DEXTROSE 2-4 GM/100ML-% IV SOLN
2.0000 g | Freq: Four times a day (QID) | INTRAVENOUS | Status: AC
  Administered 2019-09-23 – 2019-09-24 (×3): 2 g via INTRAVENOUS
  Filled 2019-09-23 (×3): qty 100

## 2019-09-23 MED ORDER — ROCURONIUM BROMIDE 10 MG/ML (PF) SYRINGE
PREFILLED_SYRINGE | INTRAVENOUS | Status: DC | PRN
  Administered 2019-09-23: 100 mg via INTRAVENOUS

## 2019-09-23 MED ORDER — ROCURONIUM BROMIDE 10 MG/ML (PF) SYRINGE
PREFILLED_SYRINGE | INTRAVENOUS | Status: AC
  Filled 2019-09-23: qty 10

## 2019-09-23 MED ORDER — ALPRAZOLAM 0.5 MG PO TABS: 0.5000 mg | ORAL_TABLET | Freq: Every day | ORAL | Status: DC | PRN

## 2019-09-23 MED ORDER — TRAZODONE HCL 50 MG PO TABS
150.0000 mg | ORAL_TABLET | Freq: Every day | ORAL | Status: DC
  Administered 2019-09-23: 150 mg via ORAL
  Filled 2019-09-23: qty 1

## 2019-09-23 MED ORDER — PHENYLEPHRINE HCL (PRESSORS) 10 MG/ML IV SOLN
INTRAVENOUS | Status: AC
  Filled 2019-09-23: qty 2

## 2019-09-23 MED ORDER — PROPOFOL 10 MG/ML IV BOLUS
INTRAVENOUS | Status: AC
  Filled 2019-09-23: qty 40

## 2019-09-23 MED ORDER — TRANEXAMIC ACID-NACL 1000-0.7 MG/100ML-% IV SOLN
1000.0000 mg | INTRAVENOUS | Status: AC
  Administered 2019-09-23: 1000 mg via INTRAVENOUS
  Filled 2019-09-23: qty 100

## 2019-09-23 MED ORDER — PHENYLEPHRINE HCL-NACL 10-0.9 MG/250ML-% IV SOLN
INTRAVENOUS | Status: DC | PRN
  Administered 2019-09-23: 50 ug/min via INTRAVENOUS

## 2019-09-23 MED ORDER — MIDAZOLAM HCL 2 MG/2ML IJ SOLN
INTRAMUSCULAR | Status: AC
  Filled 2019-09-23: qty 2

## 2019-09-23 MED ORDER — PHENOL 1.4 % MT LIQD: 1.0000 | OROMUCOSAL | Status: DC | PRN

## 2019-09-23 MED ORDER — FENTANYL CITRATE (PF) 100 MCG/2ML IJ SOLN
INTRAMUSCULAR | Status: DC | PRN
  Administered 2019-09-23 (×4): 50 ug via INTRAVENOUS

## 2019-09-23 MED ORDER — SODIUM CHLORIDE 0.9 % IV SOLN
INTRAVENOUS | Status: AC | PRN
  Administered 2019-09-23: 1000 mL

## 2019-09-23 MED ORDER — FENTANYL CITRATE (PF) 100 MCG/2ML IJ SOLN
INTRAMUSCULAR | Status: AC
  Filled 2019-09-23: qty 2

## 2019-09-23 MED ORDER — BUPIVACAINE-EPINEPHRINE (PF) 0.5% -1:200000 IJ SOLN
INTRAMUSCULAR | Status: DC | PRN
  Administered 2019-09-23: 20 mL via PERINEURAL

## 2019-09-23 MED ORDER — MIDAZOLAM HCL 5 MG/5ML IJ SOLN
INTRAMUSCULAR | Status: DC | PRN
  Administered 2019-09-23: 2 mg via INTRAVENOUS

## 2019-09-23 MED ORDER — HYDROMORPHONE HCL 1 MG/ML IJ SOLN: 0.5000 mg | INTRAMUSCULAR | Status: DC | PRN

## 2019-09-23 MED ORDER — DOCUSATE SODIUM 100 MG PO CAPS
100.0000 mg | ORAL_CAPSULE | Freq: Two times a day (BID) | ORAL | Status: DC
  Administered 2019-09-23 – 2019-09-24 (×2): 100 mg via ORAL
  Filled 2019-09-23 (×2): qty 1

## 2019-09-23 MED ORDER — OXYCODONE HCL 5 MG PO TABS
5.0000 mg | ORAL_TABLET | ORAL | Status: DC | PRN
  Administered 2019-09-23 – 2019-09-24 (×4): 10 mg via ORAL
  Filled 2019-09-23 (×3): qty 2

## 2019-09-23 MED ORDER — GABAPENTIN 300 MG PO CAPS
1200.0000 mg | ORAL_CAPSULE | Freq: Every day | ORAL | Status: DC
  Administered 2019-09-23: 1200 mg via ORAL
  Filled 2019-09-23: qty 4

## 2019-09-23 MED ORDER — SUGAMMADEX SODIUM 500 MG/5ML IV SOLN
INTRAVENOUS | Status: DC | PRN
  Administered 2019-09-23: 300 mg via INTRAVENOUS

## 2019-09-23 MED ORDER — LIDOCAINE 2% (20 MG/ML) 5 ML SYRINGE
INTRAMUSCULAR | Status: AC
  Filled 2019-09-23: qty 5

## 2019-09-23 MED ORDER — WATER FOR IRRIGATION, STERILE IR SOLN
Status: DC | PRN
  Administered 2019-09-23 (×2): 1000 mL

## 2019-09-23 MED ORDER — OXYCODONE HCL 5 MG PO TABS
10.0000 mg | ORAL_TABLET | ORAL | Status: DC | PRN
  Filled 2019-09-23: qty 2

## 2019-09-23 MED ORDER — ACETAMINOPHEN 500 MG PO TABS
1000.0000 mg | ORAL_TABLET | Freq: Four times a day (QID) | ORAL | Status: AC
  Administered 2019-09-23 – 2019-09-24 (×4): 1000 mg via ORAL
  Filled 2019-09-23 (×4): qty 2

## 2019-09-23 MED ORDER — LOSARTAN POTASSIUM 50 MG PO TABS
100.0000 mg | ORAL_TABLET | Freq: Every day | ORAL | Status: DC
  Administered 2019-09-24: 100 mg via ORAL
  Filled 2019-09-23: qty 2

## 2019-09-23 MED ORDER — DEXAMETHASONE SODIUM PHOSPHATE 10 MG/ML IJ SOLN
INTRAMUSCULAR | Status: DC | PRN
  Administered 2019-09-23: 10 mg via INTRAVENOUS

## 2019-09-23 MED ORDER — LOSARTAN POTASSIUM 50 MG PO TABS: 100.0000 mg | ORAL_TABLET | Freq: Every day | ORAL | Status: DC

## 2019-09-23 MED ORDER — ONDANSETRON HCL 4 MG PO TABS
4.0000 mg | ORAL_TABLET | Freq: Four times a day (QID) | ORAL | Status: DC | PRN
  Filled 2019-09-23: qty 1

## 2019-09-23 MED ORDER — PANTOPRAZOLE SODIUM 40 MG PO TBEC
40.0000 mg | DELAYED_RELEASE_TABLET | Freq: Every day | ORAL | Status: DC
  Administered 2019-09-23 – 2019-09-24 (×2): 40 mg via ORAL
  Filled 2019-09-23 (×2): qty 1

## 2019-09-23 MED ORDER — ONDANSETRON HCL 4 MG/2ML IJ SOLN
INTRAMUSCULAR | Status: AC
  Filled 2019-09-23: qty 2

## 2019-09-23 MED ORDER — BUPIVACAINE LIPOSOME 1.3 % IJ SUSP
INTRAMUSCULAR | Status: DC | PRN
Start: 2019-09-23 — End: 2019-09-23
  Administered 2019-09-23: 10 mL via PERINEURAL

## 2019-09-23 MED ORDER — PROPOFOL 10 MG/ML IV BOLUS
INTRAVENOUS | Status: DC | PRN
  Administered 2019-09-23: 120 mg via INTRAVENOUS

## 2019-09-23 MED ORDER — ALUMINUM HYDROXIDE GEL 320 MG/5ML PO SUSP
15.0000 mL | ORAL | Status: DC | PRN
  Filled 2019-09-23: qty 30

## 2019-09-23 MED ORDER — APOAEQUORIN 10 MG PO CAPS: 10.0000 mg | ORAL_CAPSULE | Freq: Every day | ORAL | Status: DC

## 2019-09-23 MED ORDER — CHLORHEXIDINE GLUCONATE 0.12 % MT SOLN
15.0000 mL | Freq: Once | OROMUCOSAL | Status: AC
  Administered 2019-09-23: 15 mL via OROMUCOSAL

## 2019-09-23 MED ORDER — CYCLOBENZAPRINE HCL 5 MG PO TABS
5.0000 mg | ORAL_TABLET | Freq: Every day | ORAL | Status: DC
  Administered 2019-09-23: 5 mg via ORAL
  Filled 2019-09-23: qty 1

## 2019-09-23 MED ORDER — LIDOCAINE 2% (20 MG/ML) 5 ML SYRINGE
INTRAMUSCULAR | Status: DC | PRN
  Administered 2019-09-23: 100 mg via INTRAVENOUS

## 2019-09-23 MED ORDER — AMLODIPINE BESYLATE 5 MG PO TABS
5.0000 mg | ORAL_TABLET | Freq: Every day | ORAL | Status: DC
  Administered 2019-09-24: 5 mg via ORAL
  Filled 2019-09-23: qty 1

## 2019-09-23 MED ORDER — ORAL CARE MOUTH RINSE: 15.0000 mL | Freq: Once | OROMUCOSAL | Status: AC

## 2019-09-23 MED ORDER — EPHEDRINE SULFATE-NACL 50-0.9 MG/10ML-% IV SOSY
PREFILLED_SYRINGE | INTRAVENOUS | Status: DC | PRN
  Administered 2019-09-23 (×2): 10 mg via INTRAVENOUS

## 2019-09-23 MED ORDER — SODIUM CHLORIDE 0.9 % IR SOLN
Status: DC | PRN
  Administered 2019-09-23: 1000 mL

## 2019-09-23 MED ORDER — ACETAMINOPHEN 325 MG PO TABS: 325.0000 mg | ORAL_TABLET | Freq: Four times a day (QID) | ORAL | Status: DC | PRN

## 2019-09-23 MED ORDER — SODIUM CHLORIDE 0.9 % IV SOLN: INTRAVENOUS | Status: AC

## 2019-09-23 MED ORDER — DIPHENHYDRAMINE HCL 12.5 MG/5ML PO ELIX: 12.5000 mg | ORAL_SOLUTION | ORAL | Status: DC | PRN

## 2019-09-23 SURGICAL SUPPLY — 72 items
AID PSTN UNV HD RSTRNT DISP (MISCELLANEOUS) ×1
BAG SPEC THK2 15X12 ZIP CLS (MISCELLANEOUS) ×1
BAG ZIPLOCK 12X15 (MISCELLANEOUS) ×3 IMPLANT
BIT DRILL 1.6MX128 (BIT) ×2 IMPLANT
BIT DRILL 1.6MX128MM (BIT) ×1
BLADE SAW SAG 73X25 THK (BLADE) ×2
BLADE SAW SGTL 73X25 THK (BLADE) ×1 IMPLANT
CEMENT BONE DEPUY (Cement) ×3 IMPLANT
CLOSURE STERI-STRIP 1/2X4 (GAUZE/BANDAGES/DRESSINGS)
CLOSURE WOUND 1/2 X4 (GAUZE/BANDAGES/DRESSINGS) ×1
CLSR STERI-STRIP ANTIMIC 1/2X4 (GAUZE/BANDAGES/DRESSINGS) IMPLANT
COOLER ICEMAN CLASSIC (MISCELLANEOUS) ×2 IMPLANT
COVER BACK TABLE 60X90IN (DRAPES) ×3 IMPLANT
COVER SURGICAL LIGHT HANDLE (MISCELLANEOUS) ×3 IMPLANT
COVER WAND RF STERILE (DRAPES) IMPLANT
DRAPE INCISE IOBAN 66X45 STRL (DRAPES) ×3 IMPLANT
DRAPE ORTHO SPLIT 77X108 STRL (DRAPES)
DRAPE POUCH INSTRU U-SHP 10X18 (DRAPES) ×3 IMPLANT
DRAPE SHEET LG 3/4 BI-LAMINATE (DRAPES) ×6 IMPLANT
DRAPE SURG 17X11 SM STRL (DRAPES) ×3 IMPLANT
DRAPE SURG ORHT 6 SPLT 77X108 (DRAPES) IMPLANT
DRAPE U-SHAPE 47X51 STRL (DRAPES) ×3 IMPLANT
DRSG AQUACEL AG ADV 3.5X 6 (GAUZE/BANDAGES/DRESSINGS) ×3 IMPLANT
DURAPREP 26ML APPLICATOR (WOUND CARE) ×6 IMPLANT
ELECT BLADE TIP CTD 4 INCH (ELECTRODE) ×3 IMPLANT
ELECT REM PT RETURN 15FT ADLT (MISCELLANEOUS) ×3 IMPLANT
GLENOID CORTILOC AEQUALIS L40 (Shoulder) ×2 IMPLANT
GLOVE BIO SURGEON STRL SZ7 (GLOVE) ×3 IMPLANT
GLOVE BIO SURGEON STRL SZ7.5 (GLOVE) ×3 IMPLANT
GLOVE BIOGEL PI IND STRL 7.0 (GLOVE) ×1 IMPLANT
GLOVE BIOGEL PI IND STRL 8 (GLOVE) ×1 IMPLANT
GLOVE BIOGEL PI INDICATOR 7.0 (GLOVE) ×2
GLOVE BIOGEL PI INDICATOR 8 (GLOVE) ×2
GOWN STRL REUS W/TWL LRG LVL3 (GOWN DISPOSABLE) ×6 IMPLANT
GOWN STRL REUS W/TWL XL LVL3 (GOWN DISPOSABLE) ×3 IMPLANT
GUIDEWIRE GLENOID 2.5X220 (WIRE) ×2 IMPLANT
HANDPIECE INTERPULSE COAX TIP (DISPOSABLE) ×3
HEAD HUMERAL AEQUALIS 52 SHLDR (Miscellaneous) ×2 IMPLANT
HEMOSTAT SURGICEL 2X14 (HEMOSTASIS) ×3 IMPLANT
HOOD PEEL AWAY FLYTE STAYCOOL (MISCELLANEOUS) ×9 IMPLANT
HUMERAL STEM AEQUALIS 3BX74MM (Stem) ×3 IMPLANT
KIT BASIN (CUSTOM PROCEDURE TRAY) ×3 IMPLANT
KIT TURNOVER KIT A (KITS) IMPLANT
MANIFOLD NEPTUNE II (INSTRUMENTS) ×3 IMPLANT
NDL TROCAR POINT SZ 2 1/2 (NEEDLE) ×1 IMPLANT
NEEDLE TROCAR POINT SZ 2 1/2 (NEEDLE) ×3 IMPLANT
NS IRRIG 1000ML POUR BTL (IV SOLUTION) ×3 IMPLANT
PACK SHOULDER (CUSTOM PROCEDURE TRAY) ×3 IMPLANT
PAD COLD SHLDR WRAP-ON (PAD) ×2 IMPLANT
PROTECTOR NERVE ULNAR (MISCELLANEOUS) IMPLANT
RESTRAINT HEAD UNIVERSAL NS (MISCELLANEOUS) ×3 IMPLANT
RETRIEVER SUT HEWSON (MISCELLANEOUS) ×3 IMPLANT
SET HNDPC FAN SPRY TIP SCT (DISPOSABLE) ×1 IMPLANT
SLING ARM FOAM STRAP XLG (SOFTGOODS) ×2 IMPLANT
SLING ARM IMMOBILIZER LRG (SOFTGOODS) IMPLANT
SMARTMIX MINI TOWER (MISCELLANEOUS) ×3
SPONGE LAP 18X18 RF (DISPOSABLE) ×3 IMPLANT
STEM HUMERAL AEQUALIS 3BX74MM (Stem) IMPLANT
STRIP CLOSURE SKIN 1/2X4 (GAUZE/BANDAGES/DRESSINGS) ×2 IMPLANT
SUCTION FRAZIER HANDLE 12FR (TUBING) ×3
SUCTION TUBE FRAZIER 12FR DISP (TUBING) ×1 IMPLANT
SUPPORT WRAP ARM LG (MISCELLANEOUS) ×3 IMPLANT
SUT ETHIBOND 2 V 37 (SUTURE) ×3 IMPLANT
SUT MNCRL AB 4-0 PS2 18 (SUTURE) ×3 IMPLANT
SUT VIC AB 2-0 CT1 27 (SUTURE) ×3
SUT VIC AB 2-0 CT1 TAPERPNT 27 (SUTURE) ×1 IMPLANT
TAPE LABRALWHITE 1.5X36 (TAPE) ×3 IMPLANT
TAPE SUT LABRALTAP WHT/BLK (SUTURE) ×3 IMPLANT
TOWEL OR 17X26 10 PK STRL BLUE (TOWEL DISPOSABLE) ×3 IMPLANT
TOWER SMARTMIX MINI (MISCELLANEOUS) ×1 IMPLANT
WATER STERILE IRR 1000ML POUR (IV SOLUTION) ×3 IMPLANT
YANKAUER SUCT BULB TIP 10FT TU (MISCELLANEOUS) ×3 IMPLANT

## 2019-09-23 NOTE — Anesthesia Procedure Notes (Signed)
Procedure Name: Intubation Date/Time: 09/23/2019 7:46 AM Performed by: West Pugh, CRNA Pre-anesthesia Checklist: Patient identified, Emergency Drugs available, Suction available, Patient being monitored and Timeout performed Patient Re-evaluated:Patient Re-evaluated prior to induction Oxygen Delivery Method: Circle system utilized Preoxygenation: Pre-oxygenation with 100% oxygen Induction Type: IV induction Ventilation: Mask ventilation without difficulty Laryngoscope Size: Mac and 3 Grade View: Grade I Tube type: Oral Tube size: 7.0 mm Number of attempts: 1 Airway Equipment and Method: Stylet Placement Confirmation: ETT inserted through vocal cords under direct vision,  positive ETCO2,  CO2 detector and breath sounds checked- equal and bilateral Secured at: 22 cm Tube secured with: Tape Dental Injury: Teeth and Oropharynx as per pre-operative assessment

## 2019-09-23 NOTE — Anesthesia Procedure Notes (Signed)
Anesthesia Regional Block: Interscalene brachial plexus block   Pre-Anesthetic Checklist: ,, timeout performed, Correct Patient, Correct Site, Correct Laterality, Correct Procedure, Correct Position, site marked, Risks and benefits discussed,  Surgical consent,  Pre-op evaluation,  At surgeon's request and post-op pain management  Laterality: Right  Prep: chloraprep       Needles:  Injection technique: Single-shot  Needle Type: Other     Needle Length: 9cm  Needle Gauge: 21   Needle insertion depth: 5 cm   Additional Needles:   Procedures:, nerve stimulator,,,,,,,  Narrative:  Start time: 09/23/2019 7:10 AM End time: 09/23/2019 7:20 AM Injection made incrementally with aspirations every 5 mL.  Performed by: Personally  Anesthesiologist: Arta Bruce, MD  Additional Notes: Monitors applied. Patient sedated. Sterile prep and drape,hand hygiene and sterile gloves were used. Needle position confirmed with evoked response at 0.4 mV.Local anesthetic injected incrementally after negative aspiration.Vascular puncture avoided. No complications. The patient tolerated the procedure well.

## 2019-09-23 NOTE — H&P (Signed)
Kathleen Obrien is an 64 y.o. female.   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  Pain interferes with sleep and quality of life.   Past Medical History:  Diagnosis Date  . Coronary artery disease    per cardiac cath 03-01-2011 (done at [facility]) mild nonobstructive cad  . Depression   . DJD (degenerative joint disease) of knee    right and left  . Headache    for the past 2 days ," i think its because i dont have my glasses on all the time"   . Hyperlipidemia   . Hypertension   . Pre-diabetes    "im not prediabetic anymore, my dotor took me off the medicine because i lost weight and he said i didnt need it anymore"   . Primary generalized (osteo)arthritis   . S/P gastric bypass 2018    Past Surgical History:  Procedure Laterality Date  . CARDIAC CATHETERIZATION  03-01-2011   [facility]   mild CAD, normal LVF (pCFx 30%),  ef 65%  . CESAREAN SECTION  1980  . JOINT REPLACEMENT    . LAPAROSCOPIC GASTRIC SLEEVE RESECTION  2018   Kathleen Obrien  . TOTAL KNEE ARTHROPLASTY Right 10/24/2017   Procedure: RIGHT TOTAL KNEE ARTHROPLASTY;  Surgeon: Dorna Leitz, MD;  Location: [facility];  Service: Orthopedics;  Laterality: Right;  Adductor Block  . TOTAL KNEE ARTHROPLASTY Left 01/30/2018   Procedure: LEFT TOTAL KNEE ARTHROPLASTY;  Surgeon: Dorna Leitz, MD;  Location: [facility];  Service: Orthopedics;  Laterality: Left;  . TOTAL SHOULDER ARTHROPLASTY Left 05/20/2019   Procedure: TOTAL SHOULDER ARTHROPLASTY;  Surgeon: Tania Ade, MD;  Location: [place];  Service: Orthopedics;  Laterality: Left;  Kathleen Obrien VAGINAL HYSTERECTOMY  1988    Family History  Problem Relation Age of Onset  . Arthritis Other    Social History:  reports that she has never smoked. She has never used smokeless tobacco. She reports current alcohol use. She reports that she does not use drugs.  Allergies:  Allergies   Allergen Reactions  . Banana Nausea And Vomiting    Medications Prior to Admission  Medication Sig Dispense Refill  . ALPRAZolam (XANAX) 0.5 MG tablet Take 0.5 mg by mouth daily as needed for anxiety.    Kathleen Obrien amLODipine (NORVASC) 5 MG tablet Take 5 mg by mouth daily.    Kathleen Obrien Apoaequorin (PREVAGEN) 10 MG CAPS Take 10 mg by mouth daily.    Kathleen Obrien atenolol (TENORMIN) 50 MG tablet Take 50 mg by mouth 2 (two) times daily.     . calcium-vitamin D 250-100 MG-UNIT tablet Take 1 tablet by mouth daily.     . cyclobenzaprine (FLEXERIL) 5 MG tablet Take 5 mg by mouth at bedtime.    . gabapentin (NEURONTIN) 600 MG tablet Take 1,200 mg by mouth at bedtime.     Kathleen Obrien losartan (COZAAR) 100 MG tablet Take 100 mg by mouth daily.     . meloxicam (MOBIC) 15 MG tablet Take 15 mg by mouth daily.    . Multiple Vitamins-Minerals (BARIATRIC MULTIVITAMINS/IRON PO) Take 1 capsule by mouth daily.    . nitroGLYCERIN (NITROSTAT) 0.4 MG SL tablet Place 0.4 mg under the tongue every 5 (five) minutes as needed for chest pain.    Kathleen Obrien omeprazole (PRILOSEC) 20 MG capsule Take 20 mg by mouth 2 (two) times daily.  3  . pravastatin (PRAVACHOL) 10 MG tablet Take 10 mg by mouth at  bedtime.  3  . traZODone (DESYREL) 150 MG tablet Take 150 mg by mouth at bedtime.       Results for orders placed or performed during the hospital encounter of 09/22/19 (from the past 48 hour(s))  Surgical pcr screen     Status: None   Collection Time: 09/22/19  2:07 PM   Specimen: Nasal Mucosa; Nasal Swab  Result Value Ref Range   MRSA, PCR NEGATIVE NEGATIVE   Staphylococcus aureus NEGATIVE NEGATIVE    Comment: (NOTE) The Xpert SA Assay (FDA approved for NASAL specimens in patients 23 years of age and older), is one component of a comprehensive surveillance program. It is not intended to diagnose infection nor to guide or monitor treatment. Performed at [facility], [address], [place], [place] [address]   APTT     Status: None    Collection Time: 09/22/19  2:07 PM  Result Value Ref Range   aPTT 33 24 - 36 seconds    Comment: Performed at [facility], [address], [place], [place] [address]  CBC WITH DIFFERENTIAL     Status: Abnormal   Collection Time: 09/22/19  2:07 PM  Result Value Ref Range   WBC 3.2 (L) 4.0 - 10.5 K/uL   RBC 3.93 3.87 - 5.11 MIL/uL   Hemoglobin 12.4 12.0 - 15.0 g/dL   HCT 40.0 86.7 - 61.9 %   MCV 96.7 80.0 - 100.0 fL   MCH 31.6 26.0 - 34.0 pg   MCHC 32.6 30.0 - 36.0 g/dL   RDW 50.9 32.6 - 71.2 %   Platelets 211 150 - 400 K/uL   nRBC 0.0 0.0 - 0.2 %   Neutrophils Relative % 31 %   Neutro Abs 1.0 (L) 1.7 - 7.7 K/uL   Lymphocytes Relative 58 %   Lymphs Abs 1.9 0.7 - 4.0 K/uL   Monocytes Relative 7 %   Monocytes Absolute 0.2 0.1 - 1.0 K/uL   Eosinophils Relative 3 %   Eosinophils Absolute 0.1 0.0 - 0.5 K/uL   Basophils Relative 1 %   Basophils Absolute 0.0 0.0 - 0.1 K/uL   Immature Granulocytes 0 %   Abs Immature Granulocytes 0.00 0.00 - 0.07 K/uL    Comment: Performed at [facility], [address] [place], [place] [address]  Comprehensive metabolic panel     Status: None   Collection Time: 09/22/19  2:07 PM  Result Value Ref Range   Sodium 140 135 - 145 mmol/L   Potassium 3.8 3.5 - 5.1 mmol/L   Chloride 102 98 - 111 mmol/L   CO2 30 22 - 32 mmol/L   Glucose, Bld 95 70 - 99 mg/dL    Comment: Glucose reference range applies only to samples taken after fasting for at least 8 hours.   BUN 17 8 - 23 mg/dL   Creatinine, Ser 9.83 0.44 - 1.00 mg/dL   Calcium 9.0 8.9 - 38.2 mg/dL   Total Protein 6.7 6.5 - 8.1 g/dL   Albumin 4.1 3.5 - 5.0 g/dL   AST 21 15 - 41 U/L   ALT 14 0 - 44 U/L   Alkaline Phosphatase 57 38 - 126 U/L   Total Bilirubin 0.9 0.3 - 1.2 mg/dL   GFR calc non Af Amer >60 >60 mL/min   GFR calc Af Amer >60 >60 mL/min   Anion gap 8 5 - 15    Comment: Performed at [facility] [address]  [place],  [place] [address]  Protime-INR     Status: None   Collection Time: 09/22/19  2:07 PM  Result Value Ref Range   Prothrombin Time 12.9 11.4 - 15.2 seconds   INR 1.0 0.8 - 1.2    Comment: (NOTE) INR goal varies based on device and disease states. Performed at [facility], [address] [place], [place] [address]   Type and screen Order type and screen if day of surgery is less than 15 days from draw of preadmission visit or order morning of surgery if day of surgery is greater than 6 days from preadmission visit.     Status: None   Collection Time: 09/22/19  2:07 PM  Result Value Ref Range   ABO/RH(D) O POS    Antibody Screen NEG    Sample Expiration 10/06/2019,2359    Extend sample reason      NO TRANSFUSIONS OR PREGNANCY IN THE PAST 3 MONTHS Performed at [facility], [address] [place], [place] [address]   Urinalysis, Routine w reflex microscopic     Status: Abnormal   Collection Time: 09/22/19  2:07 PM  Result Value Ref Range   Color, Urine YELLOW YELLOW   APPearance CLEAR CLEAR   Specific Gravity, Urine 1.016 1.005 - 1.030   pH 6.0 5.0 - 8.0   Glucose, UA NEGATIVE NEGATIVE mg/dL   Hgb urine dipstick NEGATIVE NEGATIVE   Bilirubin Urine NEGATIVE NEGATIVE   Ketones, ur NEGATIVE NEGATIVE mg/dL   Protein, ur NEGATIVE NEGATIVE mg/dL   Nitrite NEGATIVE NEGATIVE   Leukocytes,Ua SMALL (A) NEGATIVE   RBC / HPF 0-5 0 - 5 RBC/hpf   WBC, UA 0-5 0 - 5 WBC/hpf   Bacteria, UA NONE SEEN NONE SEEN   Squamous Epithelial / LPF 0-5 0 - 5   Mucus PRESENT     Comment: Performed at [facility], [address], [place], [place] [address]  Hemoglobin A1c     Status: Abnormal   Collection Time: 09/22/19  2:07 PM  Result Value Ref Range   Hgb A1c MFr Bld 5.7 (H) 4.8 - 5.6 %    Comment: (NOTE) Pre diabetes:          5.7%-6.4% Diabetes:              >6.4% Glycemic control for   <7.0% adults with diabetes    Mean Plasma Glucose 116.89  mg/dL    Comment: Performed at [facility], [address], [place], [place] [address]   No results found.  Review of Systems  All other systems reviewed and are negative.   Blood pressure 106/86, pulse 77, temperature 98.2 F (36.8 C), temperature source Oral, resp. rate 18, height 5\' 8"  (1.727 m), weight 123 kg, SpO2 98 %. Physical Exam  Constitutional: She is oriented to person, place, and time. She appears well-developed and well-nourished.  HENT:  Head: Atraumatic.  Eyes: EOM are normal.  Cardiovascular: Intact distal pulses.  Respiratory: Effort normal.  Musculoskeletal:     Comments: R shoulder pain with limited ROM. NVID  Neurological: She is oriented to person, place, and time.  Skin: Skin is warm and dry.  Psychiatric: She has a normal mood and affect.     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  Pain interferes with sleep and quality of life. Plan R TSA Risks / benefits of surgery discussed Consent on chart  NPO for OR Preop  antibiotics   Berline Lopes, MD 09/23/2019, 7:22 AM

## 2019-09-23 NOTE — Anesthesia Postprocedure Evaluation (Signed)
Anesthesia Post Note  Patient: Kathleen Obrien  Procedure(s) Performed: TOTAL SHOULDER ARTHROPLASTY (Right Shoulder)     Patient location during evaluation: PACU Anesthesia Type: General Level of consciousness: awake and alert Pain management: pain level controlled Vital Signs Assessment: post-procedure vital signs reviewed and stable Respiratory status: spontaneous breathing, nonlabored ventilation, respiratory function stable and patient connected to nasal cannula oxygen Cardiovascular status: blood pressure returned to baseline and stable Postop Assessment: no apparent nausea or vomiting Anesthetic complications: no    Last Vitals:  Vitals:   09/23/19 1203 09/23/19 1313  BP: 122/69 106/86  Pulse: 71 69  Resp: 16 16  Temp: 36.5 C 36.4 C  SpO2: 100%     Last Pain:  Vitals:   09/23/19 1313  TempSrc: Oral  PainSc:                  Joachim Carton DAVID

## 2019-09-23 NOTE — Discharge Instructions (Signed)
Discharge Instructions after Total Shoulder Arthroplasty   . A sling has been provided for you. Remove the sling 5 times each day to perform motion exercises. . Use ice on the shoulder intermittently over the first 48 hours after surgery.  . Pain medication has been prescribed for you.  . Use your medication liberally over the first 48 hours, and then begin to taper your use. You may take Extra Strength Tylenol or Tylenol only in place of the pain pills. DO NOT take ANY nonsteroidal anti-inflammatory pain medications: Advil, Motrin, Ibuprofen, Aleve, Naproxen, or Naprosyn. . Take one aspirin a day for 2 weeks after surgery, unless you have an aspirin sensitivity/allergy or asthma. . Leave your dressing on until your first follow up visit.  You may shower with the dressing.  Hold your arm as if you still have your sling on while you shower. . Active reaching and lifting are not permitted. You may use the operative arm for activities of daily living that do not require the operative arm to leave the side of the body, such as eating, drinking, bathing, etc.  . Three to 5 times each day you should perform assisted overhead reaching and external rotation (outward turning) exercises with the operative arm. You were taught these exercises prior to discharge. Both exercises should be done with the non-operative arm used as the "therapist arm" while the operative arm remains relaxed. Ten of each exercise should be done three to five times each day.   Overhead reach is helping to lift your stiff arm up as high as it will go. To stretch your overhead reach, lie flat on your back, relax, and grasp the wrist of the tight shoulder with your opposite hand. Using the power in your opposite arm, bring the stiff arm up as far as it is comfortable. Start holding it for ten seconds and then work up to where you can hold it for a count of 30. Breathe slowly and deeply while the arm is moved. Repeat this stretch ten times,  trying to help the ar up a little higher each time.     External rotation is turning the arm out to the side while your elbow stays close to your body. External rotation is best stretched while you are lying on your back. Hold a cane, yardstick, broom handle, or dowel in both hands. Bend both elbows to a right angle. Use steady, gentle force from your normal arm to rotate the hand of the stiff shoulder out away from your body. Continue the rotation until it is straight in front of you holding it there for a count of 10. Do not go beyond this level of rotation until seen back by Dr. Chandler. Repeat this exercise ten times slowly.      Please call [phone] during normal business hours or [phone] after hours for any problems. Including the following:  - excessive redness of the incisions - drainage for more than 4 days - fever of more than 101.5 F  *Please note that pain medications will not be refilled after hours or on weekends.  Dental Antibiotics:  In most cases prophylactic antibiotics for Dental procdeures after total joint surgery are not necessary.  Exceptions are as follows:  1. History of prior total joint infection  2. Severely immunocompromised (Organ Transplant, cancer chemotherapy, Rheumatoid biologic meds such as Humera)  3. Poorly controlled diabetes (A1C &gt; 8.0, blood glucose over 200)  If you have one of these conditions, contact your surgeon for   an antibiotic prescription, prior to your dental procedure.    

## 2019-09-23 NOTE — Transfer of Care (Signed)
Immediate Anesthesia Transfer of Care Note  Patient: Kathleen Obrien  Procedure(s) Performed: TOTAL SHOULDER ARTHROPLASTY (Right Shoulder)  Patient Location: PACU  Anesthesia Type:GA combined with regional for post-op pain  Level of Consciousness: awake and patient cooperative  Airway & Oxygen Therapy: Patient Spontanous Breathing and Patient connected to face mask oxygen  Post-op Assessment: Report given to RN and Post -op Vital signs reviewed and stable  Post vital signs: Reviewed and stable  Last Vitals:  Vitals Value Taken Time  BP 121/58 09/23/19 0932  Temp 36.3 C 09/23/19 0932  Pulse 76 09/23/19 0935  Resp 12 09/23/19 0935  SpO2 100 % 09/23/19 0935  Vitals shown include unvalidated device data.  Last Pain:  Vitals:   09/23/19 0932  TempSrc:   PainSc: (P) Asleep      Patients Stated Pain Goal: 4 (09/23/19 0630)  Complications: No apparent anesthesia complications

## 2019-09-23 NOTE — Progress Notes (Signed)
PHARMACIST - PHYSICIAN ORDER COMMUNICATION  CONCERNING: P&T Medication Policy on Herbal Medications  DESCRIPTION:  This patient's order for:  Apoaequorin  has been noted.  This product(s) is classified as an "herbal" or natural product. Due to a lack of definitive safety studies or FDA approval, nonstandard manufacturing practices, plus the potential risk of unknown drug-drug interactions while on inpatient medications, the Pharmacy and Therapeutics Committee does not permit the use of "herbal" or natural products of this type within [facility].   ACTION TAKEN: The pharmacy department is unable to verify this order at this time and your patient has been informed of this safety policy. Please reevaluate patient's clinical condition at discharge and address if the herbal or natural product(s) should be resumed at that time.  Dorna Leitz, PharmD, BCPS 09/23/2019 11:06 AM

## 2019-09-23 NOTE — Op Note (Signed)
Procedure(s): TOTAL SHOULDER ARTHROPLASTY Procedure Note  Kathleen Obrien female 64 y.o. 09/23/2019   Preoperative diagnosis: Right end-stage osteoarthritis  Post operative diagnosis: Same  Procedure(s) and Anesthesia Type:    RIGHT TOTAL SHOULDER ARTHROPLASTY - General  Surgeon(s) and Role:    Jones Broom, MD - Primary   Indications:  64 y.o. female  With endstage right shoulder arthritis. Pain and dysfunction interfered with quality of life and nonoperative treatment with activity modification, NSAIDS and injections failed.     Surgeon: Berline Lopes   Assistants: Damita Lack PA-C [facility] was present and scrubbed throughout the procedure and was essential in positioning, retraction, exposure, and closure)  Anesthesia: General endotracheal anesthesia with preoperative interscalene block given by the attending anesthesiologist   Procedure Detail  TOTAL SHOULDER ARTHROPLASTY  Findings: Tornier flex anatomic press-fit size 3 stem with a 52 x 19 head, cemented size 40 large Cortiloc glenoid.   A lesser tuberosity osteotomy was performed and repaired at the conclusion of the procedure.  Estimated Blood Loss:  200 mL         Drains: None   Blood Given: none          Specimens: none        Complications:  * No complications entered in OR log *         Disposition: PACU - hemodynamically stable.         Condition: stable    Procedure:   The patient was identified in the preoperative holding area where I personally marked the operative extremity after verifying with the patient and consent. She  was taken to the operating room where She was transferred to the   operative table.  The patient received an interscalene block in   the holding area by the attending anesthesiologist.  General anesthesia was induced   in the operating room without complication.  The patient did receive IV  Ancef prior to the commencement of the procedure.  The patient was   placed in the beach-chair position with the back raised about 30   degrees.  The nonoperative extremity and head and neck were carefully   positioned and padded protecting against neurovascular compromise.  The   left upper extremity was then prepped and draped in the standard sterile   fashion.    The appropriate operative time-out was performed with   Anesthesia, the perioperative staff, as well as myself and we all agreed   that the right side was the correct operative site.  An approximately   10 cm incision was made from the tip of the coracoid to the center point of the   humerus at the level of the axilla.  Dissection was carried down sharply   through subcutaneous tissues and cephalic vein was identified and taken   laterally with the deltoid.  The pectoralis major was taken medially.  The   upper 1 cm of the pectoralis major was released from its attachment on   the humerus.  The clavipectoral fascia was incised just lateral to the   conjoined tendon.  This incision was carried up to but not into the   coracoacromial ligament.  Digital palpation was used to prove   integrity of the axillary nerve which was protected throughout the   procedure.  Musculocutaneous nerve was not palpated in the operative   field.  Conjoined tendon was then retracted gently medially and the   deltoid laterally.  Anterior circumflex humeral vessels were clamped and  coagulated.  The soft tissues overlying the biceps was incised and this   incision was carried across the transverse humeral ligament to the base   of the coracoid.  The biceps was noted to be severely degenerated. It was released from the superior labrum. The biceps was then tenodesed to the soft tissue just above   pectoralis major and the remaining portion of the biceps superiorly was   excised.  An osteotomy was performed at the lesser tuberosity.  The capsule was then   released all the way down to the 6 o'clock position of the humeral  head.   The humeral head was then delivered with simultaneous adduction,   extension and external rotation.  All humeral osteophytes were removed   and the anatomic neck of the humerus was marked and cut free hand at   approximately 25 degrees retroversion within about 3 mm of the cuff   reflection posteriorly.  The head size was estimated to be a 52 medium   offset.  At that point, the humeral head was retracted posteriorly with   a Fukuda retractor.   Remaining portion of the capsule was released at the base of the   coracoid.  The remaining biceps anchor and the entire anterior-inferior   labrum was excised.  The posterior labrum was also excised but the   posterior capsule was not released.  The guidepin was placed bicortically with non-elevated guide.  The reamer was used to ream to concentric bone with punctate bleeding.  This gave an excellent concentric surface.  The center hole was then drilled for an anchor peg glenoid followed by the three peripheral holes and the center hole did penetrate but none of the peripheral holes did.  I then pulse irrigated these holes and dried   them with Surgicel.  The three peripheral holes were then   pressurized cemented and the anchor peg glenoid was placed and impacted   with an excellent fit.  The glenoid was a 40 large component.  The proximal humerus was then again exposed taking care not to displace the glenoid.    The entry awl was used followed by sounding reamers and then sequentially broached from size 1-3. This was then left in place and the calcar planer was used. Trial head was placed with a 52 x 19 low offset.  With the trial implantation of the component,  there was approximately 50% posterior translation with immediate snap back to the   anatomic position.  With forward elevation, there was no tendency   towards posterior subluxation.   The trial was removed and the final implant was prepared on a back table.  The trial was removed and the  final implant was prepared on a back table.   3 small holes were drilled on the medial side of the lesser tuberosity osteotomy, through which 2 labral tapes were passed. The implant was then placed through the loop of the 2 labral tapes and impacted with an excellent press-fit. This achieved excellent anatomic reconstruction of the proximal humerus.  The joint was then copiously irrigated with pulse lavage.  The subscapularis and   lesser tuberosity osteotomy were then repaired using the 2 labral tapes previously passed in a double row fashion with horizontal mattress sutures medially brought over through bone tunnels tied over a bone bridge laterally.   One #1 Ethibond was placed at the rotator interval just above   the lesser tuberosity. Copious irrigation was used. Skin was closed with 2-0  Vicryl sutures in the deep dermal layer and 4-0 Monocryl in a subcuticular  running fashion.  Sterile dressings were then applied including Aquacel.  The patient was placed in a sling and allowed to awaken from general anesthesia and taken to the recovery room in stable condition.      POSTOPERATIVE PLAN:  Early passive range of motion will be allowed with the goal of 0 degrees external rotation and 90 degrees forward elevation.  No internal rotation at this time.  No active motion of the arm until the lesser tuberosity heals.  The patient will likely be kept in the hospital for 1 days and then discharged home.

## 2019-09-23 NOTE — Progress Notes (Signed)
Pt did not take atenolol this morning, but did take last night. Per Dr. Michelle Piper, none needed to be given prior to surgery.

## 2019-09-23 NOTE — Evaluation (Signed)
Occupational Therapy Evaluation Patient Details Name: Kathleen Obrien MRN: [id number] DOB: Jul 16, 1955 Today's Date: 09/23/2019    History of Present Illness Patient is a 64 year old female s/p R TSA.   Clinical Impression   Patient instructed in and also provided handout for shoulder protocol, prescribed exercises listed below and compensatory strategies for self care. Patient verbalize understanding and is familiar due to history of L shoulder arthroplasty. Patient has significant other at home that can also assist her as needed.    Follow Up Recommendations  Follow surgeon's recommendation for DC plan and follow-up therapies    Equipment Recommendations  None recommended by OT       Precautions / Restrictions Precautions Precautions: Shoulder Type of Shoulder Precautions: distal AROM ok, no pendulums, PROM FF 0-90, ER to neutral, no ABD.  Shoulder Interventions: Shoulder sling/immobilizer;Off for dressing/bathing/exercises Precaution Booklet Issued: Yes (comment) Required Braces or Orthoses: Sling Restrictions Weight Bearing Restrictions: Yes RUE Weight Bearing: Non weight bearing      Mobility Bed Mobility Overal bed mobility: Modified Independent             General bed mobility comments: use of bed rail  Transfers Overall transfer level: Needs assistance Equipment used: None Transfers: Sit to/from Stand Sit to Stand: Supervision         General transfer comment: for safety    Balance Overall balance assessment: Mild deficits observed, not formally tested                                         ADL either performed or assessed with clinical judgement   ADL Overall ADL's : Needs assistance/impaired Eating/Feeding: Set up;Sitting   Grooming: Set up;Wash/dry hands;Wash/dry face;Sitting   Upper Body Bathing: Minimal assistance;Sitting   Lower Body Bathing: Minimal assistance;Sit to/from stand;Sitting/lateral leans   Upper Body  Dressing : Moderate assistance;Maximal assistance Upper Body Dressing Details (indicate cue type and reason): provide visual demo for UB dressing compensatory strategies Lower Body Dressing: Minimal assistance;Sitting/lateral leans;Sit to/from stand   Toilet Transfer: Supervision/safety;Ambulation;Regular Toilet   Toileting- Clothing Manipulation and Hygiene: Supervision/safety;Sitting/lateral lean;Sit to/from stand       Functional mobility during ADLs: Supervision/safety General ADL Comments: patient requiring increased assistance with dressing/bathing due to shoulder precautions and currently numb from nerve block post op day 0                  Pertinent Vitals/Pain Pain Assessment: Faces Faces Pain Scale: Hurts a little bit Pain Location: R UE Pain Descriptors / Indicators: Sore Pain Intervention(s): Premedicated before session     Hand Dominance Right   Extremity/Trunk Assessment Upper Extremity Assessment Upper Extremity Assessment: RUE deficits/detail RUE Deficits / Details: + nerve block no AROM distally  RUE Sensation: (numb from nerve block)   Lower Extremity Assessment Lower Extremity Assessment: Overall WFL for tasks assessed   Cervical / Trunk Assessment Cervical / Trunk Assessment: Normal   Communication Communication Communication: No difficulties   Cognition Arousal/Alertness: Awake/alert Behavior During Therapy: WFL for tasks assessed/performed Overall Cognitive Status: Within Functional Limits for tasks assessed                                        Exercises Exercises: Shoulder;Other exercises Other Exercises Other Exercises: educated patient in distal AROM and neck exercises,  unable to perform distal AROM at this time due to nerve block, verbalizes understanding. Instructed in PROM for shoulder flexion and ER, patient return demo with safe technique.    Shoulder Instructions Shoulder Instructions Donning/doffing shirt without  moving shoulder: Patient able to independently direct caregiver Method for sponge bathing under operated UE: Patient able to independently direct caregiver Donning/doffing sling/immobilizer: Patient able to independently direct caregiver Correct positioning of sling/immobilizer: Patient able to independently direct caregiver ROM for elbow, wrist and digits of operated UE: Patient able to independently direct caregiver Sling wearing schedule (on at all times/off for ADL's): Patient able to independently direct caregiver Proper positioning of operated UE when showering: Patient able to independently direct caregiver Positioning of UE while sleeping: Patient able to independently direct caregiver    Home Living Family/patient expects to be discharged to:: Private residence Living Arrangements: Spouse/significant other Available Help at Discharge: Family Type of Home: House Home Access: Stairs to enter [employer] of Steps: 2 front 6-7 back Entrance Stairs-Rails: Left Home Layout: One level     Bathroom Shower/Tub: Teacher, early years/pre: Sandwich - single point;Shower seat          Prior Functioning/Environment Level of Independence: Independent with assistive device(s)        Comments: intermittent use of cane, reports fall due to pinched nerve in her back few months ago but has improved         OT Problem List: Impaired UE functional use;Pain         OT Goals(Current goals can be found in the care plan section) Acute Rehab OT Goals Patient Stated Goal: get to the gym OT Goal Formulation: With patient Time For Goal Achievement: 10/07/19 Potential to Achieve Goals: Good   AM-PAC OT "6 Clicks" Daily Activity     Outcome Measure Help from another person eating meals?: A Little Help from another person taking care of personal grooming?: A Little Help from another person toileting, which includes using toliet, bedpan, or  urinal?: A Little Help from another person bathing (including washing, rinsing, drying)?: A Little Help from another person to put on and taking off regular upper body clothing?: A Lot Help from another person to put on and taking off regular lower body clothing?: A Little 6 Click Score: 17   End of Session  Activity Tolerance: Patient tolerated treatment well Patient left: in chair;with call bell/phone within reach;with chair alarm set  OT Visit Diagnosis: Pain Pain - Right/Left: Right Pain - part of body: Shoulder                Time: 3532-9924 OT Time Calculation (min): 49 min Charges:  OT General Charges $OT Visit: 1 Visit OT Evaluation $OT Eval Moderate Complexity: 1 Mod  Delbert Phenix OT Pager: [phone]  Rosemary Holms 09/23/2019, 2:58 PM

## 2019-09-23 NOTE — Anesthesia Preprocedure Evaluation (Signed)
Anesthesia Evaluation  Patient identified by MRN, date of birth, ID band Patient awake    Reviewed: Allergy & Precautions, NPO status , Patient's Chart, lab work & pertinent test results  Airway Mallampati: II  TM Distance: >3 FB Neck ROM: Full    Dental   Pulmonary    Pulmonary exam normal        Cardiovascular hypertension, Pt. on medications Normal cardiovascular exam     Neuro/Psych Depression    GI/Hepatic   Endo/Other    Renal/GU      Musculoskeletal   Abdominal   Peds  Hematology   Anesthesia Other Findings   Reproductive/Obstetrics                             Anesthesia Physical Anesthesia Plan  ASA: II  Anesthesia Plan: General   Post-op Pain Management:    Induction: Intravenous  PONV Risk Score and Plan: 3 and Midazolam, Ondansetron and Treatment may vary due to age or medical condition  Airway Management Planned: Oral ETT  Additional Equipment:   Intra-op Plan:   Post-operative Plan: Extubation in OR  Informed Consent: I have reviewed the patients History and Physical, chart, labs and discussed the procedure including the risks, benefits and alternatives for the proposed anesthesia with the patient or authorized representative who has indicated his/her understanding and acceptance.       Plan Discussed with: CRNA and Surgeon  Anesthesia Plan Comments:         Anesthesia Quick Evaluation

## 2019-09-24 ENCOUNTER — Encounter: Payer: Self-pay | Admitting: *Deleted

## 2019-09-24 DIAGNOSIS — M19011 Primary osteoarthritis, right shoulder: Secondary | ICD-10-CM | POA: Diagnosis not present

## 2019-09-24 LAB — CBC
HCT: 30.2 % — ABNORMAL LOW (ref 36.0–46.0)
Hemoglobin: 9.8 g/dL — ABNORMAL LOW (ref 12.0–15.0)
MCH: 31.2 pg (ref 26.0–34.0)
MCHC: 32.5 g/dL (ref 30.0–36.0)
MCV: 96.2 fL (ref 80.0–100.0)
Platelets: 171 10*3/uL (ref 150–400)
RBC: 3.14 MIL/uL — ABNORMAL LOW (ref 3.87–5.11)
RDW: 12.3 % (ref 11.5–15.5)
WBC: 6.3 10*3/uL (ref 4.0–10.5)
nRBC: 0 % (ref 0.0–0.2)

## 2019-09-24 LAB — BASIC METABOLIC PANEL
Anion gap: 10 (ref 5–15)
BUN: 15 mg/dL (ref 8–23)
CO2: 25 mmol/L (ref 22–32)
Calcium: 8 mg/dL — ABNORMAL LOW (ref 8.9–10.3)
Chloride: 101 mmol/L (ref 98–111)
Creatinine, Ser: 0.62 mg/dL (ref 0.44–1.00)
GFR calc Af Amer: >60 mL/min (ref >60)
GFR calc non Af Amer: >60 mL/min (ref >60)
Glucose, Bld: 171 mg/dL — ABNORMAL HIGH (ref 70–99)
Potassium: 3.8 mmol/L (ref 3.5–5.1)
Sodium: 136 mmol/L (ref 135–145)

## 2019-09-24 MED ORDER — TIZANIDINE HCL 4 MG PO TABS
4.0000 mg | ORAL_TABLET | Freq: Three times a day (TID) | ORAL | 1 refills | Status: AC | PRN
Start: 2019-09-24 — End: ?

## 2019-09-24 MED ORDER — OXYCODONE-ACETAMINOPHEN 5-325 MG PO TABS: ORAL_TABLET | ORAL | 0 refills | Status: AC

## 2019-09-24 NOTE — Plan of Care (Signed)
Plan of care reviewed and discussed with the patient. 

## 2019-09-24 NOTE — Progress Notes (Signed)
Pt provided with d/c instructions. After discussing the pt's plan of care upon d/c home, pt denied any further questions or concerns. Pt reported their ride can't get here until 1 pm.

## 2019-09-24 NOTE — Progress Notes (Signed)
   PATIENT ID: Kathleen Obrien   1 Day Post-Op Procedure(s) (LRB): TOTAL SHOULDER ARTHROPLASTY (Right)  Subjective: Doing well, 4/10 pain. No complaints. Has been through this before. Ready to go home today.   Objective:  Vitals:   09/24/19 0152 09/24/19 0623  BP: 140/66 136/84  Pulse: 62 61  Resp: 14 14  Temp: 97.6 F (36.4 C) 98.4 F (36.9 C)  SpO2: 96% 100%     R shoulder dressing c/d/i Wiggles fingers, distally NVI  Labs:  Recent Labs    09/22/19 1407 09/24/19 0257  HGB 12.4 9.8*   Recent Labs    09/22/19 1407 09/24/19 0257  WBC 3.2* 6.3  RBC 3.93 3.14*  HCT 38.0 30.2*  PLT 211 171   Recent Labs    09/22/19 1407 09/24/19 0257  NA 140 136  K 3.8 3.8  CL 102 101  CO2 30 25  BUN 17 15  CREATININE 0.75 0.62  GLUCOSE 95 171*  CALCIUM 9.0 8.0*    Assessment and Plan: 1 day s/p right TSA OT- passive protocol D/c home today when cleared by OT Fu in 2 weeks  VTE proph: asa, scds

## 2019-09-24 NOTE — Discharge Summary (Signed)
Patient ID: Kathleen Obrien MRN: [id number] DOB/AGE: Nov 16, 1955 64 y.o.  Admit date: 09/23/2019 Discharge date: 09/24/2019  Admission Diagnoses:  Active Problems:   Status post total shoulder arthroplasty, right   Discharge Diagnoses:  Same  Past Medical History:  Diagnosis Date  . Coronary artery disease    per cardiac cath 03-01-2011 (done at [facility]) mild nonobstructive cad  . Depression   . DJD (degenerative joint disease) of knee    right and left  . Headache    for the past 2 days ," i think its because i dont have my glasses on all the time"   . Hyperlipidemia   . Hypertension   . Pre-diabetes    "im not prediabetic anymore, my dotor took me off the medicine because i lost weight and he said i didnt need it anymore"   . Primary generalized (osteo)arthritis   . S/P gastric bypass 2018    Surgeries: Procedure(s): TOTAL SHOULDER ARTHROPLASTY on 09/23/2019   Consultants:   Discharged Condition: Improved  Hospital Course: Kathleen Obrien is an 64 y.o. female who was admitted 09/23/2019 for operative treatment of left shoulder end stage OA. Patient has severe unremitting pain that affects sleep, daily activities, and work/hobbies. After pre-op clearance the patient was taken to the operating room on 09/23/2019 and underwent  Procedure(s): TOTAL SHOULDER ARTHROPLASTY.    Patient was given perioperative antibiotics:  Anti-infectives (From admission, onward)   Start     Dose/Rate Route Frequency Ordered Stop   09/23/19 1400  ceFAZolin (ANCEF) IVPB 2g/100 mL premix     2 g 200 mL/hr over 30 Minutes Intravenous Every 6 hours 09/23/19 0933 09/24/19 0223   09/23/19 0600  ceFAZolin (ANCEF) 3 g in dextrose 5 % 50 mL IVPB     3 g 100 mL/hr over 30 Minutes Intravenous On call to O.R. 09/22/19 5573 09/23/19 0817       Patient was given sequential compression devices, early ambulation, and asa to prevent DVT.  Patient benefited maximally from hospital stay and there were no  complications.    Recent vital signs:  Patient Vitals for the past 24 hrs:  BP Temp Temp src Pulse Resp SpO2  09/24/19 0623 136/84 98.4 F (36.9 C) -- 61 14 100 %  09/24/19 0152 140/66 97.6 F (36.4 C) -- 62 14 96 %  09/23/19 2100 128/75 98.5 F (36.9 C) -- 86 16 98 %  09/23/19 1400 131/80 (!) 97.5 F (36.4 C) Oral 67 16 99 %  09/23/19 1313 106/86 97.7 F (36.5 C) Oral 69 16 93 %  09/23/19 1203 122/69 97.7 F (36.5 C) Oral 71 16 100 %  09/23/19 1049 (!) 151/88 -- -- 73 -- 95 %  09/23/19 1030 (!) 150/81 (!) 97.5 F (36.4 C) -- 68 11 98 %  09/23/19 1015 138/87 -- -- 70 11 98 %  09/23/19 1000 127/78 -- -- 73 18 98 %  09/23/19 0945 116/65 -- -- 72 12 100 %  09/23/19 0932 (!) 121/58 (!) 97.4 F (36.3 C) -- 75 13 99 %     Recent laboratory studies:  Recent Labs    09/22/19 1407 09/22/19 1407 09/24/19 0257  WBC 3.2*  --  6.3  HGB 12.4  --  9.8*  HCT 38.0  --  30.2*  PLT 211  --  171  NA 140  --  136  K 3.8  --  3.8  CL 102  --  101  CO2 30  --  25  BUN 17  --  15  CREATININE 0.75  --  0.62  GLUCOSE 95  --  171*  INR 1.0  --   --   CALCIUM 9.0   < > 8.0*   < > = values in this interval not displayed.     Discharge Medications:   Allergies as of 09/24/2019      Reactions   Banana Nausea And Vomiting      Medication List    STOP taking these medications   meloxicam 15 MG tablet Commonly known as: MOBIC     TAKE these medications   ALPRAZolam 0.5 MG tablet Commonly known as: XANAX Take 0.5 mg by mouth daily as needed for anxiety.   amLODipine 5 MG tablet Commonly known as: NORVASC Take 5 mg by mouth daily.   atenolol 50 MG tablet Commonly known as: TENORMIN Take 50 mg by mouth 2 (two) times daily.   BARIATRIC MULTIVITAMINS/IRON PO Take 1 capsule by mouth daily.   calcium-vitamin D 250-100 MG-UNIT tablet Take 1 tablet by mouth daily.   cyclobenzaprine 5 MG tablet Commonly known as: FLEXERIL Take 5 mg by mouth at bedtime.   gabapentin 600 MG  tablet Commonly known as: NEURONTIN Take 1,200 mg by mouth at bedtime.   losartan 100 MG tablet Commonly known as: COZAAR Take 100 mg by mouth daily.   nitroGLYCERIN 0.4 MG SL tablet Commonly known as: NITROSTAT Place 0.4 mg under the tongue every 5 (five) minutes as needed for chest pain.   omeprazole 20 MG capsule Commonly known as: PRILOSEC Take 20 mg by mouth 2 (two) times daily.   oxyCODONE-acetaminophen 5-325 MG tablet Commonly known as: Percocet Take 1-2 tablets every 4 hours as needed for post operative pain. MAX 6/day   pravastatin 10 MG tablet Commonly known as: PRAVACHOL Take 10 mg by mouth at bedtime.   Prevagen 10 MG Caps Generic drug: Apoaequorin Take 10 mg by mouth daily.   tiZANidine 4 MG tablet Commonly known as: Zanaflex Take 1 tablet (4 mg total) by mouth every 8 (eight) hours as needed for muscle spasms.   traZODone 150 MG tablet Commonly known as: DESYREL Take 150 mg by mouth at bedtime.       Diagnostic Studies: DG Shoulder Right Port  Result Date: 09/23/2019 CLINICAL DATA:  Status post total shoulder replacement EXAM: PORTABLE RIGHT SHOULDER COMPARISON:  None. FINDINGS: Frontal view obtained with a degree of external rotation. There is a total shoulder replacement right with prosthetic components well-seated. No acute fracture or dislocation. There is moderate generalized osteoarthritic change. Soft tissue air is an expected postoperative finding. Visualized right lung clear. IMPRESSION: Total shoulder replacement on the right with prosthetic components well seated. Moderate generalized osteoarthritic change. No fracture or dislocation. Electronically Signed   By: Bretta Bang III M.D.   On: 09/23/2019 11:51    Disposition: Discharge disposition: 01-Home or Self Care       Discharge Instructions    Call MD / Call 911   Complete by: As directed    If you experience chest pain or shortness of breath, CALL 911 and be transported to the  hospital emergency room.  If you develope a fever above 101 F, pus (white drainage) or increased drainage or redness at the wound, or calf pain, call your surgeon's office.   Constipation Prevention   Complete by: As directed    Drink plenty of fluids.  Prune juice may be helpful.  You may use a  stool softener, such as Colace (over the counter) 100 mg twice a day.  Use MiraLax (over the counter) for constipation as needed.   Diet - low sodium heart healthy   Complete by: As directed    Increase activity slowly as tolerated   Complete by: As directed       Follow-up Information    Tania Ade, MD. Schedule an appointment as soon as possible for a visit in 2 weeks.   Specialty: Orthopedic Surgery Contact information: [address] [place] [address] [phone]            Signed: Grier Mitts 09/24/2019, 8:21 AM

## 2020-01-28 ENCOUNTER — Other Ambulatory Visit: Payer: Self-pay | Admitting: Internal Medicine

## 2020-01-28 DIAGNOSIS — Z1231 Encounter for screening mammogram for malignant neoplasm of breast: Secondary | ICD-10-CM

## 2020-02-23 ENCOUNTER — Ambulatory Visit: Payer: Medicare Other

## 2020-03-31 ENCOUNTER — Ambulatory Visit: Payer: Medicare Other

## 2020-04-03 ENCOUNTER — Ambulatory Visit: Payer: Medicare Other

## 2020-06-12 ENCOUNTER — Other Ambulatory Visit: Payer: Self-pay | Admitting: Orthopaedic Surgery

## 2020-06-12 DIAGNOSIS — M79671 Pain in right foot: Secondary | ICD-10-CM

## 2020-06-12 DIAGNOSIS — M25571 Pain in right ankle and joints of right foot: Secondary | ICD-10-CM

## 2020-06-23 ENCOUNTER — Other Ambulatory Visit: Payer: Self-pay | Admitting: Internal Medicine

## 2020-06-23 DIAGNOSIS — Z1231 Encounter for screening mammogram for malignant neoplasm of breast: Secondary | ICD-10-CM

## 2020-06-26 ENCOUNTER — Ambulatory Visit: Payer: Medicare Other

## 2020-06-29 ENCOUNTER — Ambulatory Visit
Admission: RE | Admit: 2020-06-29 | Discharge: 2020-06-29 | Disposition: A | Discharge status: Home / Self Care | Payer: Medicare Other | Source: Ambulatory Visit | Attending: Internal Medicine | Admitting: Internal Medicine

## 2020-06-29 ENCOUNTER — Other Ambulatory Visit: Payer: Self-pay

## 2020-06-29 DIAGNOSIS — Z1231 Encounter for screening mammogram for malignant neoplasm of breast: Secondary | ICD-10-CM

## 2020-07-05 ENCOUNTER — Ambulatory Visit
Admission: RE | Admit: 2020-07-05 | Discharge: 2020-07-05 | Disposition: A | Discharge status: Home / Self Care | Payer: Medicare Other | Source: Ambulatory Visit | Attending: Orthopaedic Surgery | Admitting: Orthopaedic Surgery

## 2020-07-05 DIAGNOSIS — M79671 Pain in right foot: Secondary | ICD-10-CM

## 2020-07-05 DIAGNOSIS — M25571 Pain in right ankle and joints of right foot: Secondary | ICD-10-CM

## 2020-08-29 ENCOUNTER — Other Ambulatory Visit: Payer: Self-pay | Admitting: Orthopaedic Surgery

## 2020-09-19 ENCOUNTER — Other Ambulatory Visit (HOSPITAL_COMMUNITY): Payer: Medicare Other

## 2020-09-21 ENCOUNTER — Ambulatory Visit: Admit: 2020-09-21 | Payer: Medicare Other | Admitting: Orthopaedic Surgery

## 2020-09-21 SURGERY — FUSION, JOINT, FOOT
Anesthesia: General | Site: Foot | Laterality: Right

## 2022-11-08 ENCOUNTER — Other Ambulatory Visit: Payer: Self-pay | Admitting: Nurse Practitioner

## 2022-11-08 DIAGNOSIS — Z0000 Encounter for general adult medical examination without abnormal findings: Secondary | ICD-10-CM

## 2022-11-15 ENCOUNTER — Ambulatory Visit
Admission: RE | Admit: 2022-11-15 | Discharge: 2022-11-15 | Disposition: A | Discharge status: Home / Self Care | Payer: Medicare Other | Source: Ambulatory Visit | Attending: Nurse Practitioner | Admitting: Nurse Practitioner

## 2022-11-15 DIAGNOSIS — Z0000 Encounter for general adult medical examination without abnormal findings: Secondary | ICD-10-CM

## 2022-11-15 IMAGING — CT CT ANKLE*R* W/O CM
4 series · 16 of 36 positions shown, 18 images · non-contrast
Comparison: None.

CLINICAL DATA: Right foot and ankle pain.  No known injury.

EXAM:
CT OF THE RIGHT FOOT WITHOUT CONTRAST
CT OF THE RIGHT ANKLE WITHOUT CONTRAST
TECHNIQUE: Multidetector CT imaging of the right foot was performed according
to the standard protocol. Multiplanar CT image reconstructions were
also generated.
Multidetector CT imaging of the right ankle was performed according

[Series 4: soft tissue lower extremity · axial · 0.62mm/px · z∈[-245,-211]mm · 2 of 103 slices shown]
[im 18/103  soft-tissue]
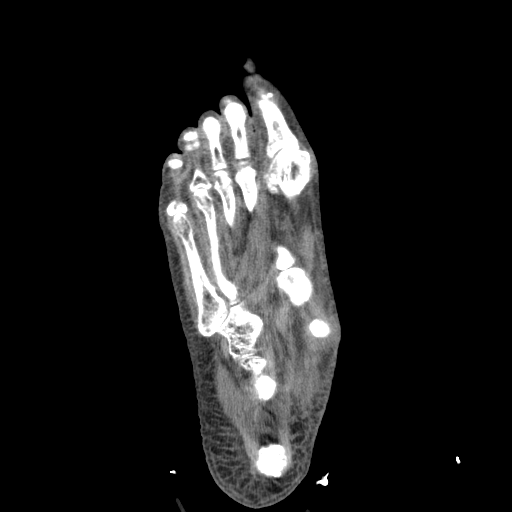
[im 35/103  soft-tissue]
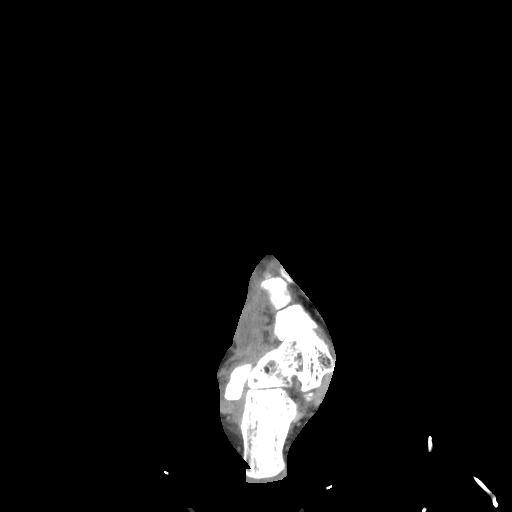

[Series 10: sagsoft tissue · sagittal · 0.33mm/px · 6 of 57 slices shown]
[im 29/57  soft-tissue]
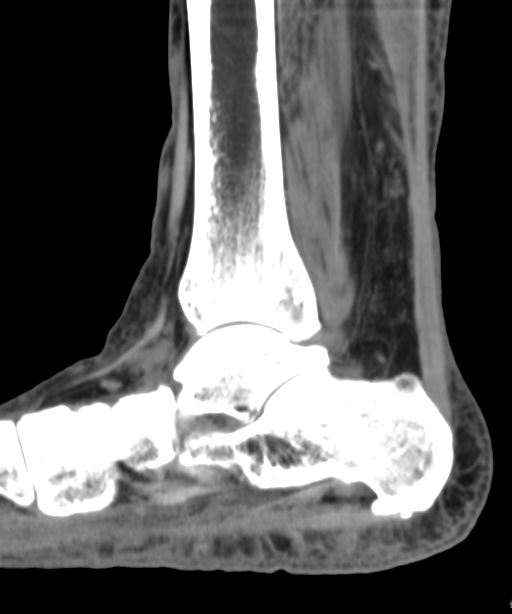
[im 36/57  bone]
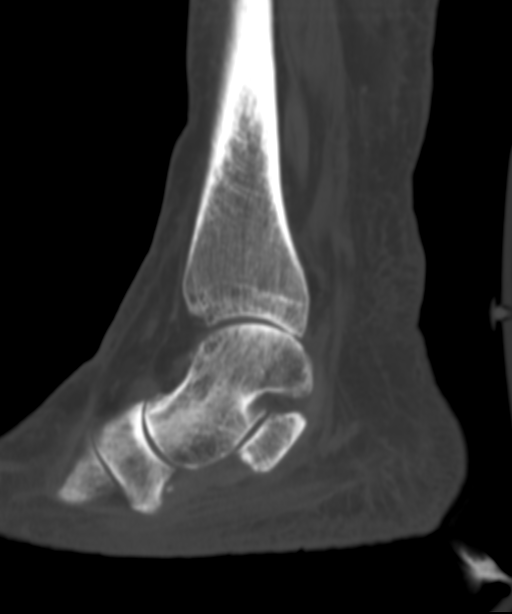
[im 38/57  bone]
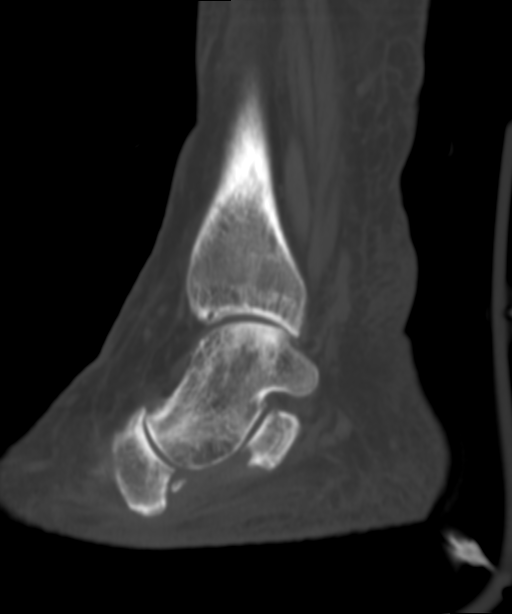
[im 41/57  bone]
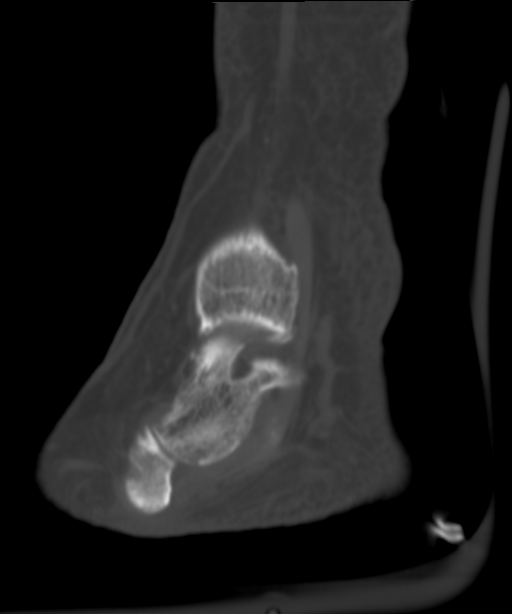
[im 43/57  bone]
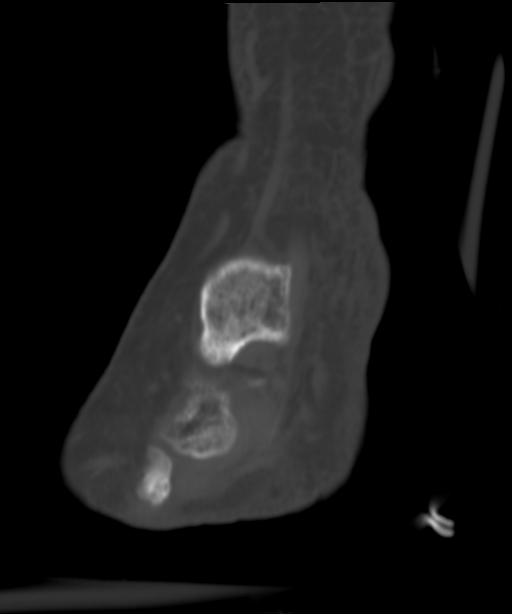
[im 46/57  bone]
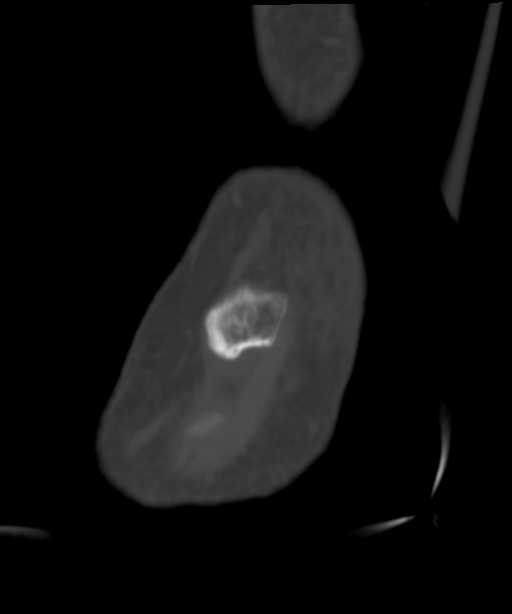

[Series 604: axial forefoot bone · coronal · 0.62mm/px · 3 of 191 slices shown]
[im 58/191  bone]
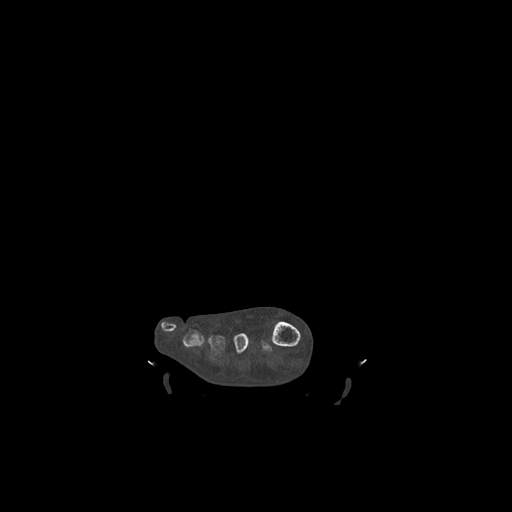
[im 83/191  bone]
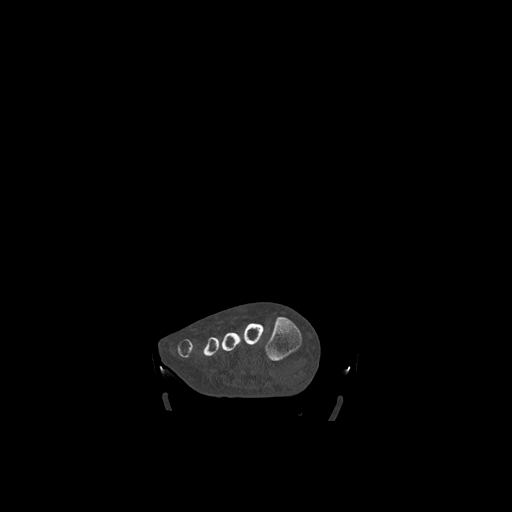
[im 108/191  bone]
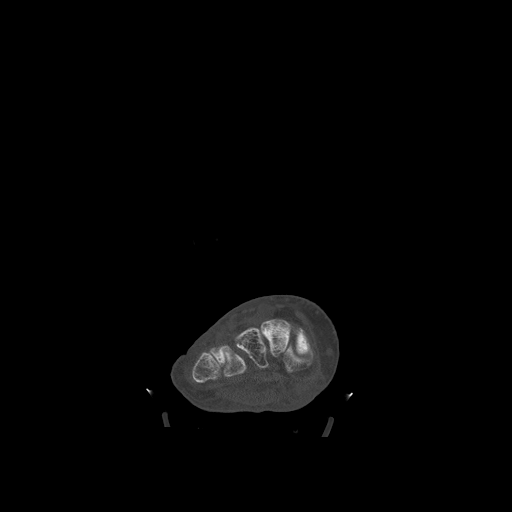

[Series 606: cor st · axial · 0.62mm/px · z∈[-319,-231]mm · 5 of 90 slices shown, 7 images]
[im 15/90  soft-tissue]
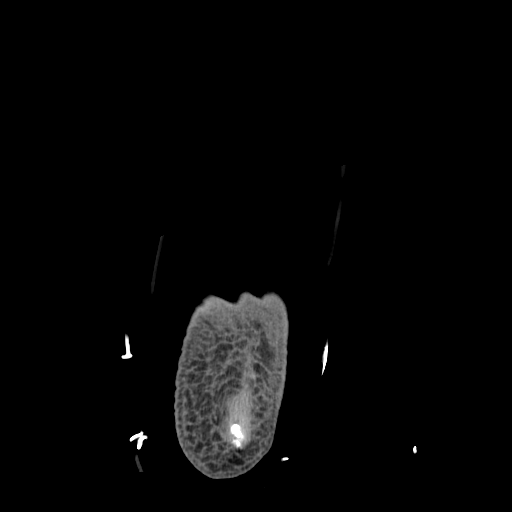
[im 15/90  bone]
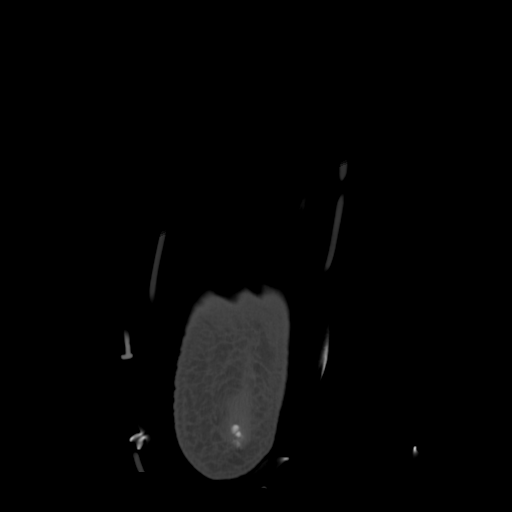
[im 30/90  bone]
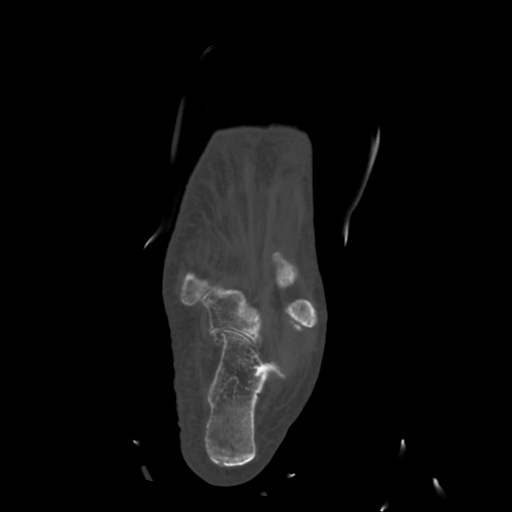
[im 45/90  bone]
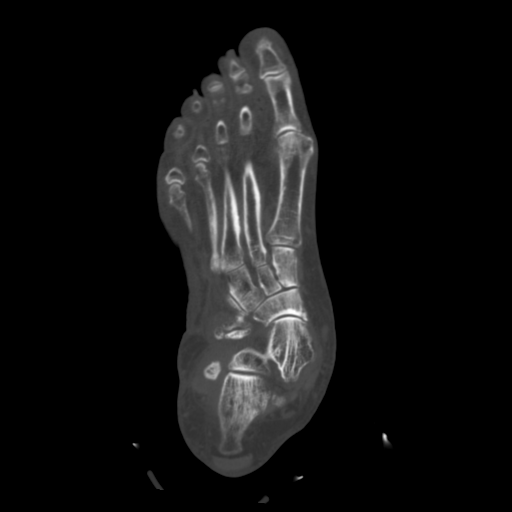
[im 60/90  bone]
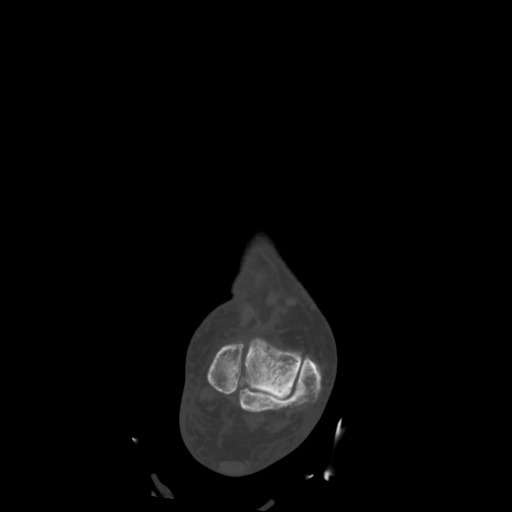
[im 75/90  soft-tissue]
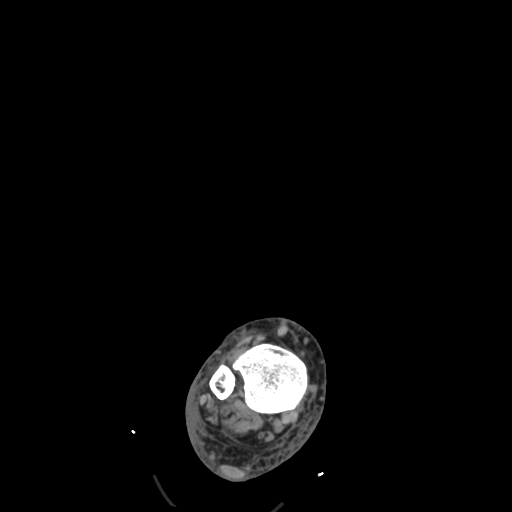
[im 75/90  bone]
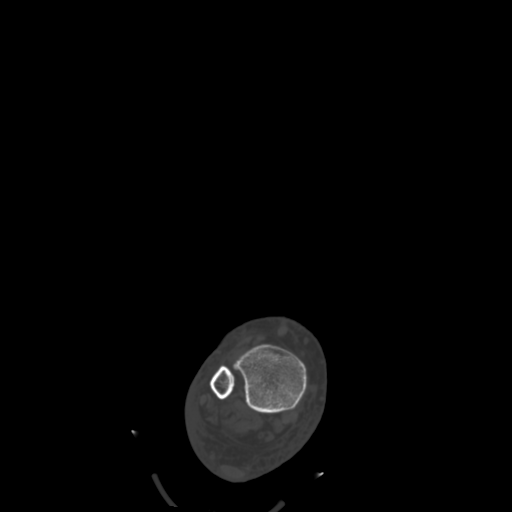

[16 of 36 positions shown; findings below may reference images not displayed]

FINDINGS: Bones/Joint/Cartilage

No fracture or dislocation. Normal alignment. No joint effusion.

Moderate tibiotalar joint space narrowing with more severe joint
space narrowing along the posterior tibiotalar joint with
subchondral cystic changes. Small loose body adjacent to the lateral
corner of the talar dome.

Subcortical cystic changes in the distal fibula, lateral talus and
to lesser extent lateral calcaneus as can be seen with lateral
hindfoot impingement.

Mild osteoarthritis of the subtalar joint. Severe osteoarthritis of
the talonavicular joint.

Ununited fracture of the anterior process of the talus.

Severe osteoarthritis of the calcaneocuboid joint.

Moderate osteoarthritis of the first MTP joint.

Severe pes planus and hindfoot valgus.

Small plantar calcaneal spur.

Ligaments

Ligaments are suboptimally evaluated by CT.

Muscles and Tendons
Muscles are normal. Flexor, extensor, peroneal and Achilles tendons
are grossly intact. Plantar fascia is intact.

Soft tissue
No fluid collection or hematoma. No soft tissue mass. Ill-defined
soft tissue prominence overlying the posterior calcaneus in the
subcutaneous fat likely reflecting pressure fibrosis.
IMPRESSION: 1. Osteoarthritic changes of the right foot and ankle as described
above.
2.  No acute osseous injury of the right foot and ankle.
3. Severe pes planus and hindfoot valgus.
4. Subcortical cystic changes in the distal fibula, lateral talus
and to lesser extent lateral calcaneus as can be seen with lateral
hindfoot impingement.
